# Patient Record
Sex: Female | Born: 1975 | Race: Black or African American | Hispanic: No | Marital: Single | Smoking: Never smoker
Health system: Southern US, Community
[De-identification: ages and names within clinical notes are randomized; demographics above are authoritative.]

## PROBLEM LIST (undated history)

## (undated) DIAGNOSIS — D649 Anemia, unspecified: Secondary | ICD-10-CM

## (undated) DIAGNOSIS — T7840XA Allergy, unspecified, initial encounter: Secondary | ICD-10-CM

## (undated) DIAGNOSIS — Z9889 Other specified postprocedural states: Secondary | ICD-10-CM

## (undated) DIAGNOSIS — I10 Essential (primary) hypertension: Secondary | ICD-10-CM

## (undated) DIAGNOSIS — R112 Nausea with vomiting, unspecified: Secondary | ICD-10-CM

## (undated) HISTORY — DX: Other specified postprocedural states: Z98.890

## (undated) HISTORY — DX: Nausea with vomiting, unspecified: R11.2

## (undated) HISTORY — DX: Allergy, unspecified, initial encounter: T78.40XA

## (undated) HISTORY — DX: Essential (primary) hypertension: I10

## (undated) HISTORY — PX: TUBAL LIGATION: SHX77

---

## 1995-02-27 HISTORY — PX: DIAGNOSTIC LAPAROSCOPY WITH REMOVAL OF ECTOPIC PREGNANCY: SHX6449

## 2000-10-11 ENCOUNTER — Emergency Department (HOSPITAL_COMMUNITY): Admission: EM | Admit: 2000-10-11 | Discharge: 2000-10-12 | Payer: Self-pay

## 2000-10-18 ENCOUNTER — Inpatient Hospital Stay (HOSPITAL_COMMUNITY): Admission: AD | Admit: 2000-10-18 | Discharge: 2000-10-18 | Payer: Self-pay | Admitting: Obstetrics & Gynecology

## 2001-03-26 ENCOUNTER — Emergency Department (HOSPITAL_COMMUNITY): Admission: EM | Admit: 2001-03-26 | Discharge: 2001-03-27 | Payer: Self-pay

## 2001-08-11 ENCOUNTER — Emergency Department (HOSPITAL_COMMUNITY): Admission: EM | Admit: 2001-08-11 | Discharge: 2001-08-11 | Payer: Self-pay | Admitting: *Deleted

## 2004-11-17 ENCOUNTER — Inpatient Hospital Stay (HOSPITAL_COMMUNITY): Admission: AD | Admit: 2004-11-17 | Discharge: 2004-11-17 | Payer: Self-pay | Admitting: Obstetrics & Gynecology

## 2005-12-21 ENCOUNTER — Emergency Department (HOSPITAL_COMMUNITY): Admission: EM | Admit: 2005-12-21 | Discharge: 2005-12-21 | Payer: Self-pay | Admitting: Family Medicine

## 2006-01-09 ENCOUNTER — Inpatient Hospital Stay (HOSPITAL_COMMUNITY): Admission: AD | Admit: 2006-01-09 | Discharge: 2006-01-09 | Payer: Self-pay | Admitting: Obstetrics and Gynecology

## 2008-08-19 ENCOUNTER — Emergency Department (HOSPITAL_COMMUNITY): Admission: EM | Admit: 2008-08-19 | Discharge: 2008-08-19 | Payer: Self-pay | Admitting: Emergency Medicine

## 2009-12-05 ENCOUNTER — Emergency Department (HOSPITAL_COMMUNITY): Admission: EM | Admit: 2009-12-05 | Discharge: 2009-12-05 | Payer: Self-pay | Admitting: Emergency Medicine

## 2009-12-12 ENCOUNTER — Emergency Department (HOSPITAL_COMMUNITY): Admission: EM | Admit: 2009-12-12 | Discharge: 2009-12-12 | Payer: Self-pay | Admitting: Emergency Medicine

## 2009-12-20 ENCOUNTER — Emergency Department (HOSPITAL_COMMUNITY): Admission: EM | Admit: 2009-12-20 | Discharge: 2009-12-20 | Payer: Self-pay | Admitting: Emergency Medicine

## 2010-05-10 LAB — DIFFERENTIAL
Basophils Absolute: 0 10*3/uL (ref 0.0–0.1)
Basophils Relative: 0 % (ref 0–1)
Lymphocytes Relative: 11 % — ABNORMAL LOW (ref 12–46)
Monocytes Relative: 8 % (ref 3–12)
Neutro Abs: 8.9 10*3/uL — ABNORMAL HIGH (ref 1.7–7.7)
Neutrophils Relative %: 81 % — ABNORMAL HIGH (ref 43–77)

## 2010-05-10 LAB — CBC
HCT: 35.3 % — ABNORMAL LOW (ref 36.0–46.0)
Hemoglobin: 11.7 g/dL — ABNORMAL LOW (ref 12.0–15.0)
MCH: 28 pg (ref 26.0–34.0)
MCHC: 33.2 g/dL (ref 30.0–36.0)
MCV: 84.3 fL (ref 78.0–100.0)
Platelets: 256 10*3/uL (ref 150–400)
RBC: 4.18 MIL/uL (ref 3.87–5.11)
RDW: 15.2 % (ref 11.5–15.5)
WBC: 11 10*3/uL — ABNORMAL HIGH (ref 4.0–10.5)

## 2010-05-10 LAB — BASIC METABOLIC PANEL
BUN: 8 mg/dL (ref 6–23)
CO2: 29 mEq/L (ref 19–32)
Calcium: 9.6 mg/dL (ref 8.4–10.5)
Chloride: 103 mEq/L (ref 96–112)
Creatinine, Ser: 0.8 mg/dL (ref 0.4–1.2)
GFR calc Af Amer: >60 mL/min (ref >60)
GFR calc non Af Amer: >60 mL/min (ref >60)
Glucose, Bld: 113 mg/dL — ABNORMAL HIGH (ref 70–99)
Potassium: 3.8 mEq/L (ref 3.5–5.1)
Sodium: 138 mEq/L (ref 135–145)

## 2012-06-03 ENCOUNTER — Emergency Department (HOSPITAL_COMMUNITY)
Admission: EM | Admit: 2012-06-03 | Discharge: 2012-06-04 | Disposition: A | Discharge status: Home / Self Care | Payer: No Typology Code available for payment source | Attending: Emergency Medicine | Admitting: Emergency Medicine

## 2012-06-03 ENCOUNTER — Encounter (HOSPITAL_COMMUNITY): Payer: Self-pay

## 2012-06-03 DIAGNOSIS — Y92410 Unspecified street and highway as the place of occurrence of the external cause: Secondary | ICD-10-CM | POA: Insufficient documentation

## 2012-06-03 DIAGNOSIS — M549 Dorsalgia, unspecified: Secondary | ICD-10-CM

## 2012-06-03 DIAGNOSIS — Y9389 Activity, other specified: Secondary | ICD-10-CM | POA: Insufficient documentation
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
## 2012-06-04 ENCOUNTER — Emergency Department (HOSPITAL_COMMUNITY): Payer: No Typology Code available for payment source

## 2012-06-04 MED ORDER — OXYCODONE-ACETAMINOPHEN 5-325 MG PO TABS: 1.0000 | ORAL_TABLET | ORAL | Status: DC | PRN

## 2012-06-04 MED ORDER — CYCLOBENZAPRINE HCL 10 MG PO TABS: 10.0000 mg | ORAL_TABLET | Freq: Two times a day (BID) | ORAL | Status: DC | PRN

## 2012-06-04 NOTE — ED Provider Notes (Signed)
Medical screening examination/treatment/procedure(s) were performed by non-physician practitioner and as supervising physician I was immediately available for consultation/collaboration. Devoria Albe, MD, Armando Gang   Ward Givens, MD 06/04/12 [phone]

## 2015-01-05 ENCOUNTER — Encounter (HOSPITAL_COMMUNITY): Payer: Self-pay

## 2015-01-05 ENCOUNTER — Inpatient Hospital Stay (HOSPITAL_COMMUNITY)
Admission: AD | Admit: 2015-01-05 | Discharge: 2015-01-05 | Disposition: A | Discharge status: Home / Self Care | Payer: Self-pay | Source: Ambulatory Visit | Attending: Family Medicine | Admitting: Family Medicine

## 2015-01-05 DIAGNOSIS — N938 Other specified abnormal uterine and vaginal bleeding: Secondary | ICD-10-CM | POA: Insufficient documentation

## 2015-01-05 DIAGNOSIS — D649 Anemia, unspecified: Secondary | ICD-10-CM | POA: Insufficient documentation

## 2015-01-05 LAB — CBC
HEMATOCRIT: 30.2 % — AB (ref 36.0–46.0)
HEMOGLOBIN: 9.5 g/dL — AB (ref 12.0–15.0)
MCH: 25.7 pg — AB (ref 26.0–34.0)
MCHC: 31.5 g/dL (ref 30.0–36.0)
MCV: 81.8 fL (ref 78.0–100.0)
Platelets: 306 10*3/uL (ref 150–400)
RBC: 3.69 MIL/uL — AB (ref 3.87–5.11)
RDW: 15.2 % (ref 11.5–15.5)
WBC: 5.6 10*3/uL (ref 4.0–10.5)

## 2015-01-05 LAB — URINALYSIS, ROUTINE W REFLEX MICROSCOPIC
BILIRUBIN URINE: NEGATIVE
GLUCOSE, UA: NEGATIVE mg/dL
KETONES UR: 15 mg/dL — AB
Leukocytes, UA: NEGATIVE
Nitrite: NEGATIVE
PROTEIN: NEGATIVE mg/dL
Specific Gravity, Urine: >1.03 — ABNORMAL HIGH (ref 1.005–1.030)
UROBILINOGEN UA: 1 mg/dL (ref 0.0–1.0)
pH: 6 (ref 5.0–8.0)

## 2015-01-05 LAB — POCT PREGNANCY, URINE: PREG TEST UR: NEGATIVE

## 2015-01-05 LAB — URINE MICROSCOPIC-ADD ON

## 2015-01-05 LAB — TSH: TSH: 0.945 u[IU]/mL (ref 0.350–4.500)

## 2015-01-05 MED ORDER — MEGESTROL ACETATE 40 MG PO TABS: 40.0000 mg | ORAL_TABLET | Freq: Every day | ORAL | Status: DC

## 2015-01-05 MED ORDER — FERROUS SULFATE 325 (65 FE) MG PO TABS
325.0000 mg | ORAL_TABLET | Freq: Two times a day (BID) | ORAL | Status: DC
Start: 2015-01-05 — End: 2015-04-13

## 2015-01-05 NOTE — Discharge Instructions (Signed)

## 2015-01-05 NOTE — MAU Provider Note (Signed)
Chief Complaint: Vaginal Bleeding   First Provider Initiated Contact with Patient 01/05/15 [phone]     SUBJECTIVE HPI: Tonya Livingston is a 39 y.o. [id number] who presents to Maternity Admissions reporting bleeding with clots for the past 3 weeks.  States has had regular periods in the past until March 2016.  She had one in March then not periods until June. Then she did not have another period until 3 weeks ago.   Not bleeding heavily. PMH is remarkable for anemia.   Location: uterine Quality: crampy Severity: mild Modifying factors:  Had a tube removed in 1997 then after her last baby had the other tube tied Signs and symptoms:  Bleeding with clots, only 1-2 pads per day  Past Medical History  Diagnosis Date  . Medical history non-contributory    OB History  Gravida Para Term Preterm AB SAB TAB Ectopic Multiple Living  5 4 4  0 1 0 0 1 0 4    # Outcome Date GA Lbr Len/2nd Weight Sex Delivery Anes PTL Lv  5 Ectopic           4 Term           3 Term           2 Term           1 Term              Past Surgical History  Procedure Laterality Date  . Tubal ligation     Social History   Social History  . Marital Status: Single    Spouse Name: N/A  . Number of Children: N/A  . Years of Education: N/A   Occupational History  . Not on file.   Social History Main Topics  . Smoking status: Never Smoker   . Smokeless tobacco: Never Used  . Alcohol Use: No  . Drug Use: No  . Sexual Activity: Yes    Birth Control/ Protection: Surgical   Other Topics Concern  . Not on file   Social History Narrative   No current facility-administered medications on file prior to encounter.   Current Outpatient Prescriptions on File Prior to Encounter  Medication Sig Dispense Refill  . cyclobenzaprine (FLEXERIL) 10 MG tablet Take 1 tablet (10 mg total) by mouth 2 (two) times daily as needed for muscle spasms. (Patient not taking: Reported on 01/05/2015) 20 tablet 0  . oxyCODONE-acetaminophen  (PERCOCET/ROXICET) 5-325 MG per tablet Take 1 tablet by mouth every 4 (four) hours as needed for pain. (Patient not taking: Reported on 01/05/2015) 15 tablet 0   No Known Allergies  I have reviewed the past Medical Hx, Surgical Hx, Social Hx, Allergies and Medications.   Review of Systems  Constitutional: Negative for fever, chills and malaise/fatigue.  Gastrointestinal: Positive for abdominal pain. Negative for nausea, vomiting, diarrhea and constipation.  Musculoskeletal: Negative for back pain.  Neurological: Negative for dizziness, weakness and headaches.    OBJECTIVE Patient Vitals for the past 24 hrs:  BP Temp Pulse Resp Height Weight  01/05/15 0932 - - - - 5\' 6"  (1.676 m) 205 lb (92.987 kg)  01/05/15 0914 124/72 mmHg 98 F (36.7 C) 79 16 - -   Constitutional: Well-developed, well-nourished female in no acute distress.  Cardiovascular: normal rate Respiratory: normal rate and effort.  GI: Abd soft, non-tender, gravid appropriate for gestational age. Pos BS x 4 MS: Extremities nontender, no edema, normal ROM Neurologic: Alert and oriented x 4.  GU: Neg CVAT.  SPECULUM EXAM:  moderate burgundy blood in vault  BIMANUAL: cervix closed; uterus slightly enlarged, especially posteriorly, no adnexal tenderness or masses  LAB RESULTS Results for orders placed or performed during the hospital encounter of 01/05/15 (from the past 24 hour(s))  Urinalysis, Routine w reflex microscopic (not at [facility])     Status: Abnormal   Collection Time: 01/05/15  9:15 AM  Result Value Ref Range   Color, Urine YELLOW YELLOW   APPearance CLOUDY (A) CLEAR   Specific Gravity, Urine >1.030 (H) 1.005 - 1.030   pH 6.0 5.0 - 8.0   Glucose, UA NEGATIVE NEGATIVE mg/dL   Hgb urine dipstick LARGE (A) NEGATIVE   Bilirubin Urine NEGATIVE NEGATIVE   Ketones, ur 15 (A) NEGATIVE mg/dL   Protein, ur NEGATIVE NEGATIVE mg/dL   Urobilinogen, UA 1.0 0.0 - 1.0 mg/dL   Nitrite NEGATIVE NEGATIVE   Leukocytes, UA  NEGATIVE NEGATIVE  Urine microscopic-add on     Status: Abnormal   Collection Time: 01/05/15  9:15 AM  Result Value Ref Range   Squamous Epithelial / LPF FEW (A) RARE   WBC, UA 0-2 <3 WBC/hpf   RBC / HPF 21-50 <3 RBC/hpf   Bacteria, UA RARE RARE  Pregnancy, urine POC     Status: None   Collection Time: 01/05/15  9:27 AM  Result Value Ref Range   Preg Test, Ur NEGATIVE NEGATIVE    IMAGING No results found. Will schedule outpatient ultrasound to look at endometrial thickness and possible fibroids  MAU COURSE Will check CBC due to long term nature of bleeding. WIll also check TSH since menses are newly irregular.   Results for orders placed or performed during the hospital encounter of 01/05/15 (from the past 24 hour(s))  Urinalysis, Routine w reflex microscopic (not at [facility])     Status: Abnormal   Collection Time: 01/05/15  9:15 AM  Result Value Ref Range   Color, Urine YELLOW YELLOW   APPearance CLOUDY (A) CLEAR   Specific Gravity, Urine >1.030 (H) 1.005 - 1.030   pH 6.0 5.0 - 8.0   Glucose, UA NEGATIVE NEGATIVE mg/dL   Hgb urine dipstick LARGE (A) NEGATIVE   Bilirubin Urine NEGATIVE NEGATIVE   Ketones, ur 15 (A) NEGATIVE mg/dL   Protein, ur NEGATIVE NEGATIVE mg/dL   Urobilinogen, UA 1.0 0.0 - 1.0 mg/dL   Nitrite NEGATIVE NEGATIVE   Leukocytes, UA NEGATIVE NEGATIVE  Urine microscopic-add on     Status: Abnormal   Collection Time: 01/05/15  9:15 AM  Result Value Ref Range   Squamous Epithelial / LPF FEW (A) RARE   WBC, UA 0-2 <3 WBC/hpf   RBC / HPF 21-50 <3 RBC/hpf   Bacteria, UA RARE RARE  Pregnancy, urine POC     Status: None   Collection Time: 01/05/15  9:27 AM  Result Value Ref Range   Preg Test, Ur NEGATIVE NEGATIVE  CBC     Status: Abnormal   Collection Time: 01/05/15 10:00 AM  Result Value Ref Range   WBC 5.6 4.0 - 10.5 K/uL   RBC 3.69 (L) 3.87 - 5.11 MIL/uL   Hemoglobin 9.5 (L) 12.0 - 15.0 g/dL   HCT 30.2 (L) 36.0 - 46.0 %   MCV 81.8 78.0 - 100.0 fL   MCH  25.7 (L) 26.0 - 34.0 pg   MCHC 31.5 30.0 - 36.0 g/dL   RDW 15.2 11.5 - 15.5 %   Platelets 306 150 - 400 K/uL  TSH     Status: None   Collection  Time: 01/05/15 10:00 AM  Result Value Ref Range   TSH 0.945 0.350 - 4.500 uIU/mL    MDM See above.  Anemia is mild, will restart Iron Will schedule out patient [place] and then f/u in clinic  ASSESSMENT Dysfunctional Uterine Bleeding Mild anemia  PLAN Discharge home in stable condition. Bleeding Precautions Rx FeSO4 bid for anemia [place] ordered for endometrial thickness and possible fibroids Rx Megace 40mg  QD Followup in clinic for possible endometrial biopsy    Medication List    ASK your doctor about these medications        cyclobenzaprine 10 MG tablet  Commonly known as:  FLEXERIL  Take 1 tablet (10 mg total) by mouth 2 (two) times daily as needed for muscle spasms.     oxyCODONE-acetaminophen 5-325 MG tablet  Commonly known as:  PERCOCET/ROXICET  Take 1 tablet by mouth every 4 (four) hours as needed for pain.         Seabron Spates, CNM 01/05/2015  10:09 AM

## 2015-01-05 NOTE — MAU Note (Signed)
Pt presents to MAU with complaints of having vaginal bleeding for 3 weeks. Pt states that she is passing clots, bleeding is not heavy, can wear one pad a day.

## 2015-01-08 ENCOUNTER — Ambulatory Visit (HOSPITAL_BASED_OUTPATIENT_CLINIC_OR_DEPARTMENT_OTHER): Admission: RE | Admit: 2015-01-08 | Payer: Self-pay | Source: Ambulatory Visit

## 2015-01-08 ENCOUNTER — Ambulatory Visit (HOSPITAL_BASED_OUTPATIENT_CLINIC_OR_DEPARTMENT_OTHER)
Admission: RE | Admit: 2015-01-08 | Discharge: 2015-01-08 | Disposition: A | Discharge status: Home / Self Care | Payer: Self-pay | Source: Ambulatory Visit | Attending: Advanced Practice Midwife | Admitting: Advanced Practice Midwife

## 2015-01-08 DIAGNOSIS — N938 Other specified abnormal uterine and vaginal bleeding: Secondary | ICD-10-CM | POA: Insufficient documentation

## 2015-01-08 DIAGNOSIS — Z8759 Personal history of other complications of pregnancy, childbirth and the puerperium: Secondary | ICD-10-CM | POA: Insufficient documentation

## 2015-01-08 DIAGNOSIS — Z9851 Tubal ligation status: Secondary | ICD-10-CM | POA: Insufficient documentation

## 2015-01-10 DIAGNOSIS — N938 Other specified abnormal uterine and vaginal bleeding: Secondary | ICD-10-CM | POA: Insufficient documentation

## 2015-02-04 ENCOUNTER — Other Ambulatory Visit (HOSPITAL_COMMUNITY)
Admission: RE | Admit: 2015-02-04 | Discharge: 2015-02-04 | Disposition: A | Discharge status: Home / Self Care | Payer: Self-pay | Source: Ambulatory Visit | Attending: Obstetrics & Gynecology | Admitting: Obstetrics & Gynecology

## 2015-02-04 ENCOUNTER — Ambulatory Visit (INDEPENDENT_AMBULATORY_CARE_PROVIDER_SITE_OTHER): Payer: Self-pay | Admitting: Obstetrics & Gynecology

## 2015-02-04 ENCOUNTER — Encounter: Payer: Self-pay | Admitting: Obstetrics & Gynecology

## 2015-02-04 VITALS — BP 119/70 | HR 70 | Ht 66.0 in | Wt 197.0 lb

## 2015-02-04 DIAGNOSIS — R87619 Unspecified abnormal cytological findings in specimens from cervix uteri: Secondary | ICD-10-CM | POA: Insufficient documentation

## 2015-02-04 DIAGNOSIS — N938 Other specified abnormal uterine and vaginal bleeding: Secondary | ICD-10-CM

## 2015-02-04 DIAGNOSIS — R938 Abnormal findings on diagnostic imaging of other specified body structures: Secondary | ICD-10-CM

## 2015-02-04 DIAGNOSIS — R9389 Abnormal findings on diagnostic imaging of other specified body structures: Secondary | ICD-10-CM

## 2015-02-04 DIAGNOSIS — D500 Iron deficiency anemia secondary to blood loss (chronic): Secondary | ICD-10-CM

## 2015-02-04 LAB — POCT PREGNANCY, URINE: PREG TEST UR: NEGATIVE

## 2015-02-04 NOTE — Addendum Note (Signed)
Addended by: Christiana Pellant A on: 02/04/2015 11:16 AM   Modules accepted: Orders

## 2015-02-04 NOTE — Progress Notes (Signed)
   Subjective:    Patient ID: Tonya Livingston, female    DOB: Jul 05, 1975, 39 y.o.   MRN: [id number]  HPI  39 S AA P40 yo lady here for follow up after an ER visit for heavy bleeding. An U/S showed a thickendd endometrium which may have debris.   Review of Systems She had a BTL.    Objective:   Physical Exam  WNWHBFNAD Breathing, conversing, and ambulating normally A cervical polyp was noted and easily removed.  UPT negative, consent signed, time out done Cervix prepped with betadine and the lower cervical lip grasped with a single tooth tenaculum Uterus sounded to 8 cm Pipelle used for 2 passes with a small amount of tissue obtained. A gritty sensation was appreciated. I am wondering if I was still in the endocervix but I could not get the Pipelle to go any further. She tolerated the procedure well.        Assessment & Plan:  DUB- send polyp for pathology and long with EMBX Rec megace for 30 days and then repeat gyn u/s

## 2015-03-07 ENCOUNTER — Ambulatory Visit (HOSPITAL_COMMUNITY)
Admission: RE | Admit: 2015-03-07 | Discharge: 2015-03-07 | Disposition: A | Discharge status: Home / Self Care | Payer: Self-pay | Source: Ambulatory Visit | Attending: Obstetrics & Gynecology | Admitting: Obstetrics & Gynecology

## 2015-03-07 DIAGNOSIS — R9389 Abnormal findings on diagnostic imaging of other specified body structures: Secondary | ICD-10-CM

## 2015-03-07 DIAGNOSIS — R938 Abnormal findings on diagnostic imaging of other specified body structures: Secondary | ICD-10-CM | POA: Insufficient documentation

## 2015-03-07 DIAGNOSIS — N939 Abnormal uterine and vaginal bleeding, unspecified: Secondary | ICD-10-CM | POA: Insufficient documentation

## 2015-03-11 ENCOUNTER — Ambulatory Visit (INDEPENDENT_AMBULATORY_CARE_PROVIDER_SITE_OTHER): Payer: PRIVATE HEALTH INSURANCE | Admitting: Obstetrics & Gynecology

## 2015-03-11 ENCOUNTER — Encounter (HOSPITAL_COMMUNITY): Payer: Self-pay | Admitting: *Deleted

## 2015-03-11 VITALS — BP 116/89 | HR 82 | Temp 98.4°F | Resp 19 | Ht 66.0 in | Wt 199.6 lb

## 2015-03-11 DIAGNOSIS — D500 Iron deficiency anemia secondary to blood loss (chronic): Secondary | ICD-10-CM | POA: Diagnosis not present

## 2015-03-11 DIAGNOSIS — N938 Other specified abnormal uterine and vaginal bleeding: Secondary | ICD-10-CM | POA: Diagnosis not present

## 2015-03-11 LAB — CBC
HEMATOCRIT: 34.6 % — AB (ref 36.0–46.0)
HEMOGLOBIN: 10.2 g/dL — AB (ref 12.0–15.0)
MCH: 23.6 pg — AB (ref 26.0–34.0)
MCHC: 29.5 g/dL — AB (ref 30.0–36.0)
MCV: 79.9 fL (ref 78.0–100.0)
MPV: 10.1 fL (ref 8.6–12.4)
Platelets: 462 10*3/uL — ABNORMAL HIGH (ref 150–400)
RBC: 4.33 MIL/uL (ref 3.87–5.11)
RDW: 18.7 % — ABNORMAL HIGH (ref 11.5–15.5)
WBC: 6.8 10*3/uL (ref 4.0–10.5)

## 2015-03-11 LAB — TSH: TSH: 1.206 u[IU]/mL (ref 0.350–4.500)

## 2015-03-11 NOTE — Progress Notes (Signed)
Patient ID: Tonya Livingston, female   DOB: 09/04/1975, 40 y.o.   MRN: [id number] Pt reports still bleeding since before her procedure. Pt can wear a maxi pad and it will last all day yet pt is concerned to why she is still bleeding.

## 2015-03-11 NOTE — Progress Notes (Signed)
   Subjective:    Patient ID: Tonya Livingston, female    DOB: 22-Sep-1975, 40 y.o.   MRN: [id number]  HPI 40 yo S AA P4 (81, 46, 41, and 45 yo kids, 3 grands) here today to discuss her EMBX results and her DUB. She has had heavy long periods and her hbg got down to 8.5. She is taking iron BID. She has been on megace. Her bleeding has slowed down on megace but is still happening every day.   Review of Systems She had a BTL in 1998.    Objective:   Physical Exam  WNWHBFNAD Breathing, conversing, and ambulating normally Abd- benign     Assessment & Plan:  DUB with anemia- recheck tsh, cbc today Schedule d&c and ablation

## 2015-03-21 ENCOUNTER — Inpatient Hospital Stay (HOSPITAL_COMMUNITY)
Admission: AD | Admit: 2015-03-21 | Discharge: 2015-03-21 | Disposition: A | Discharge status: Home / Self Care | Payer: PRIVATE HEALTH INSURANCE | Source: Ambulatory Visit | Attending: Family Medicine | Admitting: Family Medicine

## 2015-03-21 ENCOUNTER — Encounter (HOSPITAL_COMMUNITY): Payer: Self-pay | Admitting: *Deleted

## 2015-03-21 DIAGNOSIS — N946 Dysmenorrhea, unspecified: Secondary | ICD-10-CM

## 2015-03-21 DIAGNOSIS — R102 Pelvic and perineal pain: Secondary | ICD-10-CM | POA: Insufficient documentation

## 2015-03-21 LAB — URINALYSIS, ROUTINE W REFLEX MICROSCOPIC
BILIRUBIN URINE: NEGATIVE
GLUCOSE, UA: NEGATIVE mg/dL
KETONES UR: NEGATIVE mg/dL
Nitrite: NEGATIVE
PH: 7.5 (ref 5.0–8.0)
Protein, ur: 30 mg/dL — AB
SPECIFIC GRAVITY, URINE: 1.015 (ref 1.005–1.030)

## 2015-03-21 LAB — URINE MICROSCOPIC-ADD ON

## 2015-03-21 LAB — CBC
HEMATOCRIT: 33.3 % — AB (ref 36.0–46.0)
HEMOGLOBIN: 10.4 g/dL — AB (ref 12.0–15.0)
MCH: 24.4 pg — AB (ref 26.0–34.0)
MCHC: 31.2 g/dL (ref 30.0–36.0)
MCV: 78 fL (ref 78.0–100.0)
Platelets: 329 10*3/uL (ref 150–400)
RBC: 4.27 MIL/uL (ref 3.87–5.11)
RDW: 18.7 % — AB (ref 11.5–15.5)
WBC: 8.4 10*3/uL (ref 4.0–10.5)

## 2015-03-21 LAB — POCT PREGNANCY, URINE: Preg Test, Ur: NEGATIVE

## 2015-03-21 MED ORDER — KETOROLAC TROMETHAMINE 60 MG/2ML IM SOLN
60.0000 mg | Freq: Once | INTRAMUSCULAR | Status: AC
  Administered 2015-03-21: 60 mg via INTRAMUSCULAR
  Filled 2015-03-21: qty 2

## 2015-03-21 MED ORDER — IBUPROFEN 800 MG PO TABS: 800.0000 mg | ORAL_TABLET | Freq: Three times a day (TID) | ORAL | Status: DC | PRN

## 2015-03-21 NOTE — MAU Provider Note (Signed)
History     CSN: [id number]  Arrival date and time: 03/21/15 2002   First Provider Initiated Contact with Patient 03/21/15 2208      Chief Complaint  Patient presents with  . Vaginal Bleeding   HPI Comments: Tonya Livingston is a 40 y.o. [id number] who presents today with cramping and bleeding. She states that she been bleeding for 90 days. She was seen in the office on 1/13 and had an [place] on 1/9. She is scheduled for a D&C, and ablation on 2/15. She states that yesterday the bleeding and pain had increased. She states that it is better today. She has been using a heating pad with some relief. She has not tried any other medications for the cramping.   Vaginal Bleeding The patient's primary symptoms include pelvic pain and vaginal bleeding. This is a new problem. The current episode started more than 1 month ago. The problem occurs intermittently. The problem has been unchanged. Pain severity now: 7/10. The problem affects both sides. She is not pregnant. Associated symptoms include abdominal pain and nausea. Pertinent negatives include no chills, constipation, diarrhea, dysuria, fever, frequency, urgency or vomiting. The vaginal discharge was bloody. The vaginal bleeding is typical of menses. She has not been passing clots. She has not been passing tissue. Nothing aggravates the symptoms. She has tried heating pads for the symptoms. The treatment provided moderate relief. Her menstrual history has been irregular.     Past Medical History  Diagnosis Date  . Medical history non-contributory     Past Surgical History  Procedure Laterality Date  . Tubal ligation      Family History  Problem Relation Age of Onset  . Diabetes Maternal Aunt   . Diabetes Maternal Grandmother   . Hypertension Maternal Grandmother     Social History  Substance Use Topics  . Smoking status: Never Smoker   . Smokeless tobacco: Never Used  . Alcohol Use: No    Allergies: No Known  Allergies  Prescriptions prior to admission  Medication Sig Dispense Refill Last Dose  . cyclobenzaprine (FLEXERIL) 10 MG tablet Take 1 tablet (10 mg total) by mouth 2 (two) times daily as needed for muscle spasms. (Patient not taking: Reported on 01/05/2015) 20 tablet 0 Not Taking  . ferrous sulfate (FERROUSUL) 325 (65 FE) MG tablet Take 1 tablet (325 mg total) by mouth 2 (two) times daily. 60 tablet 1 Taking  . megestrol (MEGACE) 40 MG tablet Take 1 tablet (40 mg total) by mouth daily. 60 tablet 5 Taking  . oxyCODONE-acetaminophen (PERCOCET/ROXICET) 5-325 MG per tablet Take 1 tablet by mouth every 4 (four) hours as needed for pain. (Patient not taking: Reported on 01/05/2015) 15 tablet 0 Not Taking    Review of Systems  Constitutional: Negative for fever and chills.  Gastrointestinal: Positive for nausea and abdominal pain. Negative for vomiting, diarrhea and constipation.  Genitourinary: Positive for vaginal bleeding and pelvic pain. Negative for dysuria, urgency and frequency.   Physical Exam   Blood pressure 122/80, pulse 85, temperature 98.5 F (36.9 C), temperature source Oral, resp. rate 18, height 5\' 4"  (1.626 m), weight 88.962 kg (196 lb 2 oz), last menstrual period 12/17/2014.  Physical Exam  Nursing note and vitals reviewed. Constitutional: She is oriented to person, place, and time. She appears well-developed and well-nourished. No distress.  HENT:  Head: Normocephalic.  Cardiovascular: Normal rate.   Respiratory: Effort normal.  GI: Soft. There is no tenderness. There is no rebound.  Neurological: She is alert  and oriented to person, place, and time.  Skin: Skin is warm and dry.   Results for orders placed or performed during the hospital encounter of 03/21/15 (from the past 24 hour(s))  Urinalysis, Routine w reflex microscopic (not at [facility])     Status: Abnormal   Collection Time: 03/21/15  8:19 PM  Result Value Ref Range   Color, Urine YELLOW YELLOW   APPearance  CLOUDY (A) CLEAR   Specific Gravity, Urine 1.015 1.005 - 1.030   pH 7.5 5.0 - 8.0   Glucose, UA NEGATIVE NEGATIVE mg/dL   Hgb urine dipstick LARGE (A) NEGATIVE   Bilirubin Urine NEGATIVE NEGATIVE   Ketones, ur NEGATIVE NEGATIVE mg/dL   Protein, ur 30 (A) NEGATIVE mg/dL   Nitrite NEGATIVE NEGATIVE   Leukocytes, UA TRACE (A) NEGATIVE  Urine microscopic-add on     Status: Abnormal   Collection Time: 03/21/15  8:19 PM  Result Value Ref Range   Squamous Epithelial / LPF 0-5 (A) NONE SEEN   WBC, UA 0-5 0 - 5 WBC/hpf   RBC / HPF TOO NUMEROUS TO COUNT 0 - 5 RBC/hpf   Bacteria, UA FEW (A) NONE SEEN   Urine-Other MUCOUS PRESENT   Pregnancy, urine POC     Status: None   Collection Time: 03/21/15  8:33 PM  Result Value Ref Range   Preg Test, Ur NEGATIVE NEGATIVE  CBC     Status: Abnormal   Collection Time: 03/21/15  9:33 PM  Result Value Ref Range   WBC 8.4 4.0 - 10.5 K/uL   RBC 4.27 3.87 - 5.11 MIL/uL   Hemoglobin 10.4 (L) 12.0 - 15.0 g/dL   HCT 33.3 (L) 36.0 - 46.0 %   MCV 78.0 78.0 - 100.0 fL   MCH 24.4 (L) 26.0 - 34.0 pg   MCHC 31.2 30.0 - 36.0 g/dL   RDW 18.7 (H) 11.5 - 15.5 %   Platelets 329 150 - 400 K/uL    MAU Course  Procedures  MDM Patient has had toradol. She reports her pain is better at this time.   Assessment and Plan   1. Dysmenorrhea    DC home Comfort measures reviewed  Bleeding precautions RX: ibuprofen 800mg  PRN #30  Return to MAU as needed FU with OB as planned  Follow-up Information    Follow up with Emily Filbert., MD.   Specialty:  Obstetrics and Gynecology   Why:  As needed   Contact information:   [place] [address] [phone]         Mathis Bud 03/21/2015, 10:13 PM

## 2015-03-21 NOTE — MAU Note (Addendum)
PT SAYS  SHE HAS BLEEDING  SINCE  LMP  IN    OCT.       SAW   DR  [facility]OVE   ON 1-9.   IS  [facility]  FOR    SURGERY  ON 2-15.     USUALLY   DOES  NOT  HAVE  PAIN   BUT  THIS  PAIN STARTED  YESTERDAY.      NO MEDS  FOR PAIN - ONLY HEATING  PAD.       PAD ON IN TRIAGE-     SMALL AMT  LIGHT  RED.       NO BIRTH  CONTROL.  LAST SEX-  3  MTHS  AGO.

## 2015-03-21 NOTE — Discharge Instructions (Signed)

## 2015-04-13 ENCOUNTER — Encounter (HOSPITAL_COMMUNITY)
Admission: RE | Disposition: A | Discharge status: Home / Self Care | Payer: Self-pay | Source: Ambulatory Visit | Attending: Obstetrics & Gynecology

## 2015-04-13 ENCOUNTER — Ambulatory Visit (HOSPITAL_COMMUNITY)
Admission: RE | Admit: 2015-04-13 | Discharge: 2015-04-13 | Disposition: A | Discharge status: Home / Self Care | Payer: PRIVATE HEALTH INSURANCE | Source: Ambulatory Visit | Attending: Obstetrics & Gynecology | Admitting: Obstetrics & Gynecology

## 2015-04-13 ENCOUNTER — Ambulatory Visit (HOSPITAL_COMMUNITY): Payer: PRIVATE HEALTH INSURANCE | Admitting: Certified Registered Nurse Anesthetist

## 2015-04-13 ENCOUNTER — Encounter (HOSPITAL_COMMUNITY): Payer: Self-pay | Admitting: Certified Registered Nurse Anesthetist

## 2015-04-13 DIAGNOSIS — N938 Other specified abnormal uterine and vaginal bleeding: Secondary | ICD-10-CM | POA: Diagnosis not present

## 2015-04-13 DIAGNOSIS — D649 Anemia, unspecified: Secondary | ICD-10-CM | POA: Diagnosis not present

## 2015-04-13 HISTORY — PX: DILITATION & CURRETTAGE/HYSTROSCOPY WITH NOVASURE ABLATION: SHX5568

## 2015-04-13 HISTORY — DX: Anemia, unspecified: D64.9

## 2015-04-13 LAB — CBC
HEMATOCRIT: 33 % — AB (ref 36.0–46.0)
HEMOGLOBIN: 9.8 g/dL — AB (ref 12.0–15.0)
MCH: 23 pg — AB (ref 26.0–34.0)
MCHC: 29.7 g/dL — ABNORMAL LOW (ref 30.0–36.0)
MCV: 77.5 fL — AB (ref 78.0–100.0)
PLATELETS: 337 10*3/uL (ref 150–400)
RBC: 4.26 MIL/uL (ref 3.87–5.11)
RDW: 17.2 % — ABNORMAL HIGH (ref 11.5–15.5)
WBC: 6.2 10*3/uL (ref 4.0–10.5)

## 2015-04-13 LAB — PREGNANCY, URINE: PREG TEST UR: NEGATIVE

## 2015-04-13 SURGERY — DILATATION & CURETTAGE/HYSTEROSCOPY WITH NOVASURE ABLATION
Anesthesia: General | Site: Uterus

## 2015-04-13 MED ORDER — DEXAMETHASONE SODIUM PHOSPHATE 10 MG/ML IJ SOLN
INTRAMUSCULAR | Status: DC | PRN
  Administered 2015-04-13: 4 mg via INTRAVENOUS

## 2015-04-13 MED ORDER — SCOPOLAMINE 1 MG/3DAYS TD PT72
MEDICATED_PATCH | TRANSDERMAL | Status: AC
  Administered 2015-04-13: 1.5 mg via TRANSDERMAL
  Filled 2015-04-13: qty 1

## 2015-04-13 MED ORDER — PROPOFOL 10 MG/ML IV BOLUS
INTRAVENOUS | Status: AC
  Filled 2015-04-13: qty 20

## 2015-04-13 MED ORDER — SCOPOLAMINE 1 MG/3DAYS TD PT72
1.0000 | MEDICATED_PATCH | Freq: Once | TRANSDERMAL | Status: DC
  Administered 2015-04-13: 1.5 mg via TRANSDERMAL

## 2015-04-13 MED ORDER — FENTANYL CITRATE (PF) 100 MCG/2ML IJ SOLN
INTRAMUSCULAR | Status: AC
  Filled 2015-04-13: qty 2

## 2015-04-13 MED ORDER — MEPERIDINE HCL 25 MG/ML IJ SOLN: 6.2500 mg | INTRAMUSCULAR | Status: DC | PRN

## 2015-04-13 MED ORDER — HYDROCODONE-ACETAMINOPHEN 7.5-325 MG PO TABS: 1.0000 | ORAL_TABLET | Freq: Once | ORAL | Status: DC | PRN

## 2015-04-13 MED ORDER — FENTANYL CITRATE (PF) 100 MCG/2ML IJ SOLN
INTRAMUSCULAR | Status: DC | PRN
  Administered 2015-04-13 (×2): 50 ug via INTRAVENOUS

## 2015-04-13 MED ORDER — IBUPROFEN 600 MG PO TABS: 600.0000 mg | ORAL_TABLET | Freq: Four times a day (QID) | ORAL | Status: DC | PRN

## 2015-04-13 MED ORDER — LIDOCAINE HCL (CARDIAC) 20 MG/ML IV SOLN
INTRAVENOUS | Status: DC | PRN
  Administered 2015-04-13: 50 mg via INTRAVENOUS

## 2015-04-13 MED ORDER — MIDAZOLAM HCL 2 MG/2ML IJ SOLN
INTRAMUSCULAR | Status: DC | PRN
  Administered 2015-04-13: 2 mg via INTRAVENOUS

## 2015-04-13 MED ORDER — BUPIVACAINE HCL (PF) 0.5 % IJ SOLN
INTRAMUSCULAR | Status: AC
  Filled 2015-04-13: qty 30

## 2015-04-13 MED ORDER — OXYCODONE-ACETAMINOPHEN 5-325 MG PO TABS: 1.0000 | ORAL_TABLET | Freq: Four times a day (QID) | ORAL | Status: DC | PRN

## 2015-04-13 MED ORDER — LACTATED RINGERS IV SOLN
INTRAVENOUS | Status: DC
  Administered 2015-04-13 (×2): via INTRAVENOUS

## 2015-04-13 MED ORDER — KETOROLAC TROMETHAMINE 30 MG/ML IJ SOLN
30.0000 mg | Freq: Once | INTRAMUSCULAR | Status: AC
  Administered 2015-04-13: 30 mg via INTRAVENOUS

## 2015-04-13 MED ORDER — ONDANSETRON HCL 4 MG/2ML IJ SOLN
INTRAMUSCULAR | Status: AC
  Filled 2015-04-13: qty 2

## 2015-04-13 MED ORDER — PROPOFOL 10 MG/ML IV BOLUS
INTRAVENOUS | Status: DC | PRN
  Administered 2015-04-13: 200 mg via INTRAVENOUS

## 2015-04-13 MED ORDER — ONDANSETRON HCL 4 MG/2ML IJ SOLN
4.0000 mg | Freq: Once | INTRAMUSCULAR | Status: AC | PRN
  Administered 2015-04-13: 4 mg via INTRAVENOUS

## 2015-04-13 MED ORDER — FENTANYL CITRATE (PF) 100 MCG/2ML IJ SOLN
25.0000 ug | INTRAMUSCULAR | Status: DC | PRN
  Administered 2015-04-13: 50 ug via INTRAVENOUS

## 2015-04-13 MED ORDER — ONDANSETRON HCL 4 MG/2ML IJ SOLN
INTRAMUSCULAR | Status: DC | PRN
  Administered 2015-04-13: 4 mg via INTRAVENOUS

## 2015-04-13 MED ORDER — KETOROLAC TROMETHAMINE 30 MG/ML IJ SOLN
INTRAMUSCULAR | Status: AC
  Filled 2015-04-13: qty 1

## 2015-04-13 MED ORDER — MIDAZOLAM HCL 2 MG/2ML IJ SOLN
INTRAMUSCULAR | Status: AC
  Filled 2015-04-13: qty 2

## 2015-04-13 MED ORDER — DEXAMETHASONE SODIUM PHOSPHATE 4 MG/ML IJ SOLN
INTRAMUSCULAR | Status: AC
  Filled 2015-04-13: qty 1

## 2015-04-13 MED ORDER — BUPIVACAINE HCL 0.5 % IJ SOLN
INTRAMUSCULAR | Status: DC | PRN
  Administered 2015-04-13: 25 mL

## 2015-04-13 SURGICAL SUPPLY — 14 items
ABLATOR ENDOMETRIAL BIPOLAR (ABLATOR) ×2 IMPLANT
CLOTH BEACON ORANGE TIMEOUT ST (SAFETY) ×2 IMPLANT
GLOVE BIO SURGEON STRL SZ 6.5 (GLOVE) ×2 IMPLANT
GLOVE BIOGEL PI IND STRL 7.0 (GLOVE) ×1 IMPLANT
GLOVE BIOGEL PI INDICATOR 7.0 (GLOVE) ×1
GOWN STRL REUS W/TWL LRG LVL3 (GOWN DISPOSABLE) ×6 IMPLANT
NEEDLE SPNL 18GX3.5 QUINCKE PK (NEEDLE) ×2 IMPLANT
PACK VAGINAL MINOR WOMEN LF (CUSTOM PROCEDURE TRAY) ×2 IMPLANT
PAD OB MATERNITY 4.3X12.25 (PERSONAL CARE ITEMS) ×2 IMPLANT
SYR 30ML LL (SYRINGE) ×2 IMPLANT
TOWEL OR 17X24 6PK STRL BLUE (TOWEL DISPOSABLE) ×4 IMPLANT
TUBING AQUILEX INFLOW (TUBING) ×2 IMPLANT
TUBING AQUILEX OUTFLOW (TUBING) ×2 IMPLANT
WATER STERILE IRR 1000ML POUR (IV SOLUTION) ×2 IMPLANT

## 2015-04-13 NOTE — Transfer of Care (Signed)
Immediate Anesthesia Transfer of Care Note  Patient: Tonya Livingston  Procedure(s) Performed: Procedure(s): DILATATION & CURETTAGE/ WITH NOVASURE ABLATION (N/A)  Patient Location: PACU  Anesthesia Type:General  Level of Consciousness: awake, alert  and oriented  Airway & Oxygen Therapy: Patient Spontanous Breathing  Post-op Assessment: Report given to RN and Post -op Vital signs reviewed and stable  Post vital signs: Reviewed and stable  Last Vitals:  Filed Vitals:   04/13/15 0914  BP: 138/81  Pulse: 70  Temp: 36.6 C  Resp: 18    Complications: No apparent anesthesia complications

## 2015-04-13 NOTE — Discharge Instructions (Signed)
Dilation and Curettage or Vacuum Curettage, Care After Refer to this sheet in the next few weeks. These instructions provide you with information on caring for yourself after your procedure. Your health care provider may also give you more specific instructions. Your treatment has been planned according to current medical practices, but problems sometimes occur. Call your health care provider if you have any problems or questions after your procedure. WHAT TO EXPECT AFTER THE PROCEDURE After your procedure, it is typical to have light cramping and bleeding. This may last for 2 days to 2 weeks after the procedure. HOME CARE INSTRUCTIONS   Do not drive for 24 hours.  Wait 1 week before returning to strenuous activities.  Take your temperature 2 times a day for 4 days and write it down. Provide these temperatures to your health care provider if you develop a fever.  Avoid long periods of standing.  Avoid heavy lifting, pushing, or pulling. Do not lift anything heavier than 10 pounds (4.5 kg).  Limit stair climbing to once or twice a day.  Take rest periods often.  You may resume your usual diet.  Drink enough fluids to keep your urine clear or pale yellow.  Your usual bowel function should return. If you have constipation, you may:  Take a mild laxative with permission from your health care provider.  Add fruit and bran to your diet.  Drink more fluids.  Take showers instead of baths until your health care provider gives you permission to take baths.  Do not go swimming or use a hot tub until your health care provider approves.  Try to have someone with you or available to you the first 24-48 hours, especially if you were given a general anesthetic.  Do not douche, use tampons, or have sex (intercourse) for 2 weeks after the procedure.  Only take over-the-counter or prescription medicines as directed by your health care provider. Do not take aspirin. It can cause  bleeding.  Follow up with your health care provider as directed. SEEK MEDICAL CARE IF:   You have increasing cramps or pain that is not relieved with medicine.  You have abdominal pain that does not seem to be related to the same area of earlier cramping and pain.  You have bad smelling vaginal discharge.  You have a rash.  You are having problems with any medicine. SEEK IMMEDIATE MEDICAL CARE IF:   You have bleeding that is heavier than a normal menstrual period.  You have a fever.  You have chest pain.  You have shortness of breath.  You feel dizzy or feel like fainting.  You pass out.  You have pain in your shoulder strap area.  You have heavy vaginal bleeding with or without blood clots. MAKE SURE YOU:   Understand these instructions.  Will watch your condition.  Will get help right away if you are not doing well or get worse.   This information is not intended to replace advice given to you by your health care provider. Make sure you discuss any questions you have with your health care provider.   Document Released: 02/10/2000 Document Revised: 02/17/2013 Document Reviewed: 09/11/2012 Elsevier Interactive Patient Education 2016 [place]: D&C / D&E The following instructions have been prepared to help you care for yourself upon your return home.   Personal hygiene:  Use sanitary pads for vaginal drainage, not tampons.  Shower the day after your procedure.  NO tub baths, pools or Jacuzzis for 2-3 weeks.  Wipe front to back after using the bathroom.  Activity and limitations:  Do NOT drive or operate any equipment for 24 hours. The effects of anesthesia are still present and drowsiness may result.  Do NOT rest in bed all day.  Walking is encouraged.  Walk up and down stairs slowly.  You may resume your normal activity in one to two days or as indicated by your physician.  Sexual activity: NO intercourse for at least 2  weeks after the procedure, or as indicated by your physician.  Diet: Eat a light meal as desired this evening. You may resume your usual diet tomorrow.  Return to work: You may resume your work activities in one to two days or as indicated by your doctor.  What to expect after your surgery: Expect to have vaginal bleeding/discharge for 2-3 days and spotting for up to 10 days. It is not unusual to have soreness for up to 1-2 weeks. You may have a slight burning sensation when you urinate for the first day. Mild cramps may continue for a couple of days. You may have a regular period in 2-6 weeks.  Call your doctor for any of the following:  Excessive vaginal bleeding, saturating and changing one pad every hour.  Inability to urinate 6 hours after discharge from hospital.  Pain not relieved by pain medication.  Fever of 100.4 F or greater.  Unusual vaginal discharge or odor.   Call for an appointment:    Patients signature: ______________________  Nurses signature ________________________  Support person's signature_______________________

## 2015-04-13 NOTE — Anesthesia Preprocedure Evaluation (Addendum)
Anesthesia Evaluation  Patient identified by MRN, date of birth, ID band Patient awake    Reviewed: Allergy & Precautions, NPO status , Patient's Chart, lab work & pertinent test results  History of Anesthesia Complications (+) PONV  Airway Mallampati: II  TM Distance: >3 FB Neck ROM: Full    Dental no notable dental hx.    Pulmonary neg pulmonary ROS,    Pulmonary exam normal breath sounds clear to auscultation       Cardiovascular negative cardio ROS Normal cardiovascular exam Rhythm:Regular Rate:Normal     Neuro/Psych negative neurological ROS  negative psych ROS   GI/Hepatic negative GI ROS, Neg liver ROS,   Endo/Other  negative endocrine ROS  Renal/GU negative Renal ROS     Musculoskeletal negative musculoskeletal ROS (+)   Abdominal (+) + obese,   Peds  Hematology negative hematology ROS (+) anemia ,   Anesthesia Other Findings   Reproductive/Obstetrics negative OB ROS                            Anesthesia Physical Anesthesia Plan  ASA: II  Anesthesia Plan: General   Post-op Pain Management:    Induction: Intravenous  Airway Management Planned: LMA  Additional Equipment:   Intra-op Plan:   Post-operative Plan: Extubation in OR  Informed Consent: I have reviewed the patients History and Physical, chart, labs and discussed the procedure including the risks, benefits and alternatives for the proposed anesthesia with the patient or authorized representative who has indicated his/her understanding and acceptance.   Dental advisory given  Plan Discussed with: CRNA  Anesthesia Plan Comments:        Anesthesia Quick Evaluation

## 2015-04-13 NOTE — H&P (Signed)
Tonya Livingston is an 40 y.o. S AA P4 (24, 21, 75, and 71 yo kids, 3 grands) here today to have a d&c with endometrial ablation.  She has had heavy long periods and her hbg got down to 8.5. She is taking iron BID. Her hbg is currently 9.8. Her u/s shows possible adenomyosis. Her embx was benign. She has been on megace. Her bleeding has slowed down on megace but is still happening every day.    No LMP recorded.    Past Medical History  Diagnosis Date  . Medical history non-contributory   . Anemia   . Vaginal delivery 1998    Past Surgical History  Procedure Laterality Date  . Tubal ligation    . Diagnostic laparoscopy with removal of ectopic pregnancy  1997    Family History  Problem Relation Age of Onset  . Diabetes Maternal Aunt   . Diabetes Maternal Grandmother   . Hypertension Maternal Grandmother     Social History:  reports that she has never smoked. She has never used smokeless tobacco. She reports that she does not drink alcohol or use illicit drugs.  Allergies: No Known Allergies  Prescriptions prior to admission  Medication Sig Dispense Refill Last Dose  . ferrous sulfate (FERROUSUL) 325 (65 FE) MG tablet Take 1 tablet (325 mg total) by mouth 2 (two) times daily. 60 tablet 1 Past Month at Unknown time  . ibuprofen (ADVIL,MOTRIN) 800 MG tablet Take 1 tablet (800 mg total) by mouth every 8 (eight) hours as needed. (Patient taking differently: Take 800 mg by mouth every 8 (eight) hours as needed for mild pain. ) 30 tablet 0 04/11/2015    ROS  Blood pressure 138/81, pulse 70, temperature 97.9 F (36.6 C), temperature source Oral, resp. rate 18, height 5\' 6"  (1.676 m), weight 88.905 kg (196 lb), SpO2 100 %. Physical Exam Heart- rrr Lungs- CTAB Abd- benign Results for orders placed or performed during the hospital encounter of 04/13/15 (from the past 24 hour(s))  Pregnancy, urine     Status: None   Collection Time: 04/13/15  9:26 AM  Result Value Ref Range   Preg  Test, Ur NEGATIVE NEGATIVE  CBC     Status: Abnormal   Collection Time: 04/13/15  9:26 AM  Result Value Ref Range   WBC 6.2 4.0 - 10.5 K/uL   RBC 4.26 3.87 - 5.11 MIL/uL   Hemoglobin 9.8 (L) 12.0 - 15.0 g/dL   HCT 33.0 (L) 36.0 - 46.0 %   MCV 77.5 (L) 78.0 - 100.0 fL   MCH 23.0 (L) 26.0 - 34.0 pg   MCHC 29.7 (L) 30.0 - 36.0 g/dL   RDW 17.2 (H) 11.5 - 15.5 %   Platelets 337 150 - 400 K/uL    No results found.  Assessment/Plan: DUB/anemia- plan for d&c and endometrial ablation.  She understands the risks of surgery, including, but not to infection, bleeding, DVTs, damage to bowel, bladder, ureters. She wishes to proceed.     Khianna Blazina C. 04/13/2015, 10:05 AM

## 2015-04-13 NOTE — Anesthesia Procedure Notes (Signed)
Procedure Name: LMA Insertion Date/Time: 04/13/2015 10:46 AM Performed by: Bufford Spikes Pre-anesthesia Checklist: Patient identified, Timeout performed, Emergency Drugs available, Suction available and Patient being monitored Patient Re-evaluated:Patient Re-evaluated prior to inductionOxygen Delivery Method: Circle system utilized Preoxygenation: Pre-oxygenation with 100% oxygen Intubation Type: IV induction Ventilation: Mask ventilation without difficulty LMA: LMA inserted LMA Size: 4.0 Tube type: Oral Number of attempts: 1 Placement Confirmation: positive ETCO2 and breath sounds checked- equal and bilateral Tube secured with: Tape Dental Injury: Teeth and Oropharynx as per pre-operative assessment

## 2015-04-13 NOTE — Op Note (Signed)
04/13/2015  11:15 AM  PATIENT:  Tonya Livingston  40 y.o. female  PRE-OPERATIVE DIAGNOSIS:  DYSFUNCTIONAL UTERINE BLEEDING , ANEMIA  POST-OPERATIVE DIAGNOSIS:  same  PROCEDURE:  Procedure(s): DILATATION & CURETTAGE/ WITH NOVASURE ABLATION (N/A)  SURGEON:  Surgeon(s) and Role:    * Emily Filbert, MD - Primary  ANESTHESIA:   general and paracervical block  EBL:  Total I/O In: 1000 [I.V.:1000] Out: 5 [Blood:5]  BLOOD ADMINISTERED:none  DRAINS: none   LOCAL MEDICATIONS USED:  MARCAINE     SPECIMEN:  Source of Specimen:  uterine curettings  DISPOSITION OF SPECIMEN:  PATHOLOGY  COUNTS:  YES  TOURNIQUET:  * No tourniquets in log *  DICTATION: .Dragon Dictation  PLAN OF CARE: Discharge to home after PACU  PATIENT DISPOSITION:  PACU - hemodynamically stable.   Delay start of Pharmacological VTE agent (>24hrs) due to surgical blood loss or risk of bleeding: not applicable    The risks, benefits, and alternatives of surgery were explained, understood, and accepted. All questions were answered. Consents were signed. In the operating room general anesthesia was applied without complication, and she was placed in the dorsal lithotomy position. Her vagina was prepped and draped in the usual sterile fashion.  A bimanual exam revealed a  Normal size and shape, retroverted and retroflexed mobile uterus. Her adnexa were nonenlarged. A speculum was placed and a single-tooth tenaculum was used to grasp the posterior lip of her cervix. A total of 30 mL of 0.5% Marcaine was used to perform a paracervical block. Her uterus sounded to 9 cm. Her cervix was carefully and slowly dilated to accommodate a small curette. A curettage was done in all quadrants and the fundus of the uterus. A moderate amount of   tissue was obtained. A gritty sensation was appreciated throughout. I then proceeded with the Novasure ablation. Her cavity length was 4 cm and the width was 3 cm. It passed its test and ran without  incident. There was bleeding from the needle site (from the paracervical block). Pressure and silver nitrate did not yield hemostasis so I used a 3-0 vicryl suture to place a figure of eight suture. Excellent hemostasis was noted.  She was taken to the recovery room after being extubated. She tolerated the procedure well.

## 2015-04-13 NOTE — Anesthesia Postprocedure Evaluation (Signed)
Anesthesia Post Note  Patient: Tonya Livingston  Procedure(s) Performed: Procedure(s) (LRB): DILATATION & CURETTAGE/ WITH NOVASURE ABLATION (N/A)  Patient location during evaluation: PACU Anesthesia Type: General Level of consciousness: awake Pain management: pain level controlled Vital Signs Assessment: post-procedure vital signs reviewed and stable Respiratory status: spontaneous breathing Cardiovascular status: stable Postop Assessment: no signs of nausea or vomiting Anesthetic complications: no    Last Vitals:  Filed Vitals:   04/13/15 1215 04/13/15 1230  BP: 135/90   Pulse: 66 65  Temp:    Resp: 18 17    Last Pain: There were no vitals filed for this visit.               [place]

## 2015-04-14 ENCOUNTER — Encounter (HOSPITAL_COMMUNITY): Payer: Self-pay | Admitting: Obstetrics & Gynecology

## 2015-04-27 ENCOUNTER — Encounter: Payer: Self-pay | Admitting: *Deleted

## 2015-11-22 ENCOUNTER — Encounter (HOSPITAL_COMMUNITY): Payer: Self-pay | Admitting: *Deleted

## 2015-11-22 ENCOUNTER — Inpatient Hospital Stay (HOSPITAL_COMMUNITY)
Admission: AD | Admit: 2015-11-22 | Discharge: 2015-11-22 | Disposition: A | Discharge status: Home / Self Care | Payer: PRIVATE HEALTH INSURANCE | Source: Ambulatory Visit | Attending: Family Medicine | Admitting: Family Medicine

## 2015-11-22 DIAGNOSIS — R109 Unspecified abdominal pain: Secondary | ICD-10-CM

## 2015-11-22 DIAGNOSIS — N939 Abnormal uterine and vaginal bleeding, unspecified: Secondary | ICD-10-CM | POA: Insufficient documentation

## 2015-11-22 LAB — URINALYSIS, ROUTINE W REFLEX MICROSCOPIC
BILIRUBIN URINE: NEGATIVE
GLUCOSE, UA: NEGATIVE mg/dL
Ketones, ur: 15 mg/dL — AB
Nitrite: NEGATIVE
Protein, ur: NEGATIVE mg/dL
Specific Gravity, Urine: 1.02 (ref 1.005–1.030)
pH: 6.5 (ref 5.0–8.0)

## 2015-11-22 LAB — URINE MICROSCOPIC-ADD ON

## 2015-11-22 LAB — CBC
HCT: 36.9 % (ref 36.0–46.0)
Hemoglobin: 12.1 g/dL (ref 12.0–15.0)
MCH: 26.5 pg (ref 26.0–34.0)
MCHC: 32.8 g/dL (ref 30.0–36.0)
MCV: 80.7 fL (ref 78.0–100.0)
PLATELETS: 278 10*3/uL (ref 150–400)
RBC: 4.57 MIL/uL (ref 3.87–5.11)
RDW: 15.4 % (ref 11.5–15.5)
WBC: 5.8 10*3/uL (ref 4.0–10.5)

## 2015-11-22 LAB — POCT PREGNANCY, URINE: Preg Test, Ur: NEGATIVE

## 2015-11-22 MED ORDER — IBUPROFEN 800 MG PO TABS
800.0000 mg | ORAL_TABLET | Freq: Once | ORAL | Status: AC
  Administered 2015-11-22: 800 mg via ORAL
  Filled 2015-11-22: qty 1

## 2015-11-22 MED ORDER — NAPROXEN 500 MG PO TABS: 500.0000 mg | ORAL_TABLET | Freq: Two times a day (BID) | ORAL | 0 refills | Status: DC

## 2015-11-22 NOTE — MAU Provider Note (Signed)
History     CSN: [id number]  Arrival date and time: 11/22/15 [id number]   First Provider Initiated Contact with Patient 11/22/15 1042      Chief Complaint  Patient presents with  . Vaginal Bleeding  . Abdominal Pain   HPI  Tonya Livingston is a 40 y.o. female who presents for abdominal pain & vaginal bleeding. Symptoms began yesterday. Reports lower abdominal cramping that is constant. Rates pain 10/10. Has not take medication for pain. Used heating pad last night with some relief.  Vaginal bleeding began yesterday as well and was lighter than menses until this morning. Reports increase in bleeding with some small clots. Not saturating pads. Dr. Hulan Fray performed ablation on her in February of this year; she has not had follow up with her. This is her second episode of bleeding since the procedure.   OB History    Gravida Para Term Preterm AB Living   5 3 3  0 1 4   SAB TAB Ectopic Multiple Live Births   0 0 1 0        Past Medical History:  Diagnosis Date  . Anemia   . Vaginal delivery 1998    Past Surgical History:  Procedure Laterality Date  . DIAGNOSTIC LAPAROSCOPY WITH REMOVAL OF ECTOPIC PREGNANCY  1997  . DILITATION & CURRETTAGE/HYSTROSCOPY WITH NOVASURE ABLATION N/A 04/13/2015   Procedure: DILATATION & CURETTAGE/ WITH NOVASURE ABLATION;  Surgeon: Emily Filbert, MD;  Location: [facility];  Service: Gynecology;  Laterality: N/A;  . TUBAL LIGATION      Family History  Problem Relation Age of Onset  . Diabetes Maternal Aunt   . Diabetes Maternal Grandmother   . Hypertension Maternal Grandmother     Social History  Substance Use Topics  . Smoking status: Never Smoker  . Smokeless tobacco: Never Used  . Alcohol use No    Allergies: No Known Allergies  No prescriptions prior to admission.    Review of Systems  Constitutional: Negative.   Gastrointestinal: Positive for abdominal pain. Negative for constipation, diarrhea, nausea and vomiting.  Genitourinary: Negative for  dysuria.       + vaginal bleeding   Physical Exam   Blood pressure 118/78, pulse 74, temperature 98.1 F (36.7 C), temperature source Oral, resp. rate 18, weight 194 lb 3.2 oz (88.1 kg), last menstrual period 11/21/2015.  Physical Exam  Nursing note and vitals reviewed. Constitutional: She is oriented to person, place, and time. She appears well-developed and well-nourished. No distress.  HENT:  Head: Normocephalic and atraumatic.  Eyes: Conjunctivae are normal. Right eye exhibits no discharge. Left eye exhibits no discharge. No scleral icterus.  Neck: Normal range of motion.  Cardiovascular: Normal rate.   Respiratory: Effort normal. No respiratory distress.  GI: Soft. She exhibits no distension. There is no tenderness. There is no rebound and no guarding.  Genitourinary: Uterus normal. Cervix exhibits no motion tenderness. Right adnexum displays no mass and no tenderness. Left adnexum displays no mass and no tenderness.  Genitourinary Comments: Small amount of dark red blood. No active bleeding  Neurological: She is alert and oriented to person, place, and time.  Skin: Skin is warm and dry. She is not diaphoretic.  Psychiatric: She has a normal mood and affect. Her behavior is normal. Judgment and thought content normal.    MAU Course  Procedures Results for orders placed or performed during the hospital encounter of 11/22/15 (from the past 24 hour(s))  Urinalysis, Routine w reflex microscopic (not at [facility])  Status: Abnormal   Collection Time: 11/22/15 10:10 AM  Result Value Ref Range   Color, Urine YELLOW YELLOW   APPearance HAZY (A) CLEAR   Specific Gravity, Urine 1.020 1.005 - 1.030   pH 6.5 5.0 - 8.0   Glucose, UA NEGATIVE NEGATIVE mg/dL   Hgb urine dipstick LARGE (A) NEGATIVE   Bilirubin Urine NEGATIVE NEGATIVE   Ketones, ur 15 (A) NEGATIVE mg/dL   Protein, ur NEGATIVE NEGATIVE mg/dL   Nitrite NEGATIVE NEGATIVE   Leukocytes, UA TRACE (A) NEGATIVE  Urine  microscopic-add on     Status: Abnormal   Collection Time: 11/22/15 10:10 AM  Result Value Ref Range   Squamous Epithelial / LPF 0-5 (A) NONE SEEN   WBC, UA 0-5 0 - 5 WBC/hpf   RBC / HPF TOO NUMEROUS TO COUNT 0 - 5 RBC/hpf   Bacteria, UA RARE (A) NONE SEEN  Pregnancy, urine POC     Status: None   Collection Time: 11/22/15 10:13 AM  Result Value Ref Range   Preg Test, Ur NEGATIVE NEGATIVE  CBC     Status: None   Collection Time: 11/22/15 10:24 AM  Result Value Ref Range   WBC 5.8 4.0 - 10.5 K/uL   RBC 4.57 3.87 - 5.11 MIL/uL   Hemoglobin 12.1 12.0 - 15.0 g/dL   HCT 36.9 36.0 - 46.0 %   MCV 80.7 78.0 - 100.0 fL   MCH 26.5 26.0 - 34.0 pg   MCHC 32.8 30.0 - 36.0 g/dL   RDW 15.4 11.5 - 15.5 %   Platelets 278 150 - 400 K/uL    MDM UPT negative CBC -- hemoglobin 12.1 VSS Ibuprofen 800 mg PO   Assessment and Plan  A: 1. Abnormal uterine bleeding (AUB)   2. Abdominal cramping     P: Discharge home  Rx naproxen Schedule f/u with Dr. Hulan Fray if bleeding continues 2+ weeks   Jorje Guild 11/22/2015, 10:41 AM

## 2015-11-22 NOTE — Discharge Instructions (Signed)

## 2015-11-22 NOTE — MAU Note (Signed)
Dysfunctional uterine bleeding and she is hurting.  Bleeding started yesterday.  Changing every couple hours.  Had ablation in February.  This bleeding and pain is different

## 2016-01-13 ENCOUNTER — Ambulatory Visit: Payer: PRIVATE HEALTH INSURANCE | Admitting: Obstetrics & Gynecology

## 2017-03-21 IMAGING — US US TRANSVAGINAL NON-OB
1 series · 15 of 25 positions shown · non-contrast
Comparison: 01/09/2006.

CLINICAL DATA: Thickened endometrium.  Abnormal uterine bleeding.

EXAM:
ULTRASOUND PELVIS TRANSVAGINAL
TECHNIQUE: Transvaginal ultrasound examination of the pelvis was performed
including evaluation of the uterus, ovaries, adnexal regions, and
pelvic cul-de-sac.
..

[Series 1: us transvaginal non-ob · 41 acquisitions, 15 frames shown]
[im 1/41]
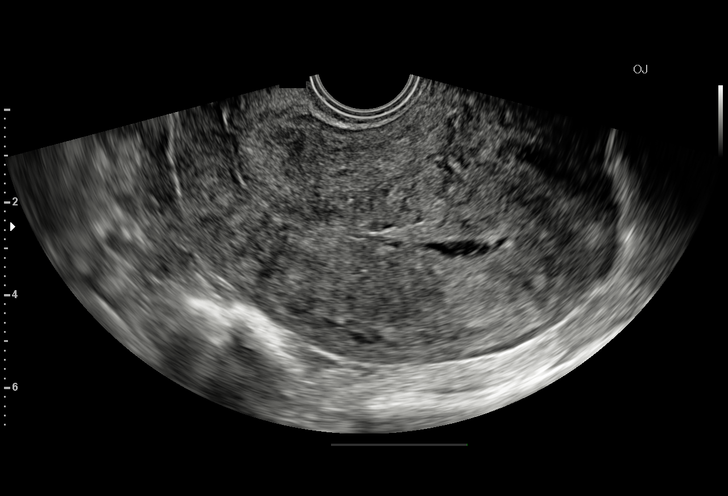
[im 4/41]
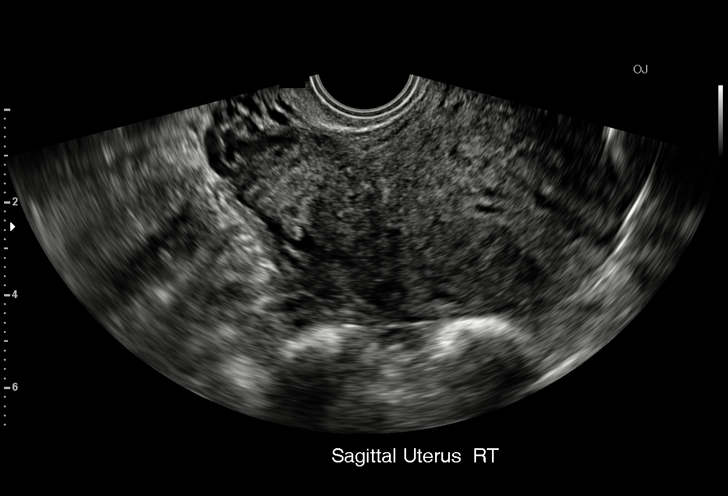
[im 7/41]
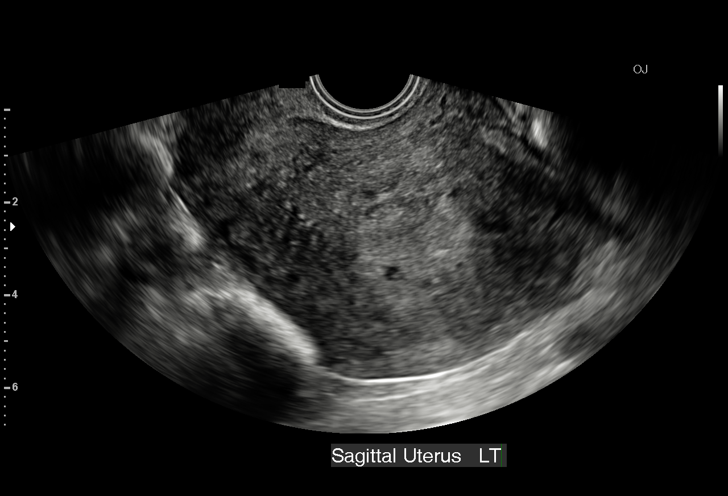
[im 9/41]
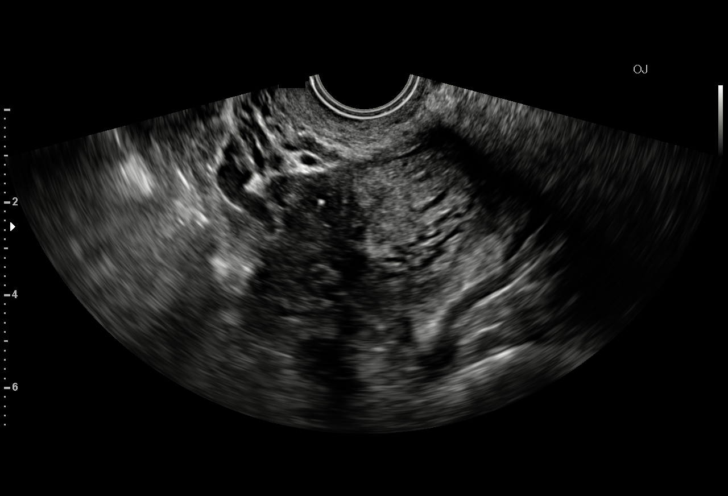
[im 12/41]
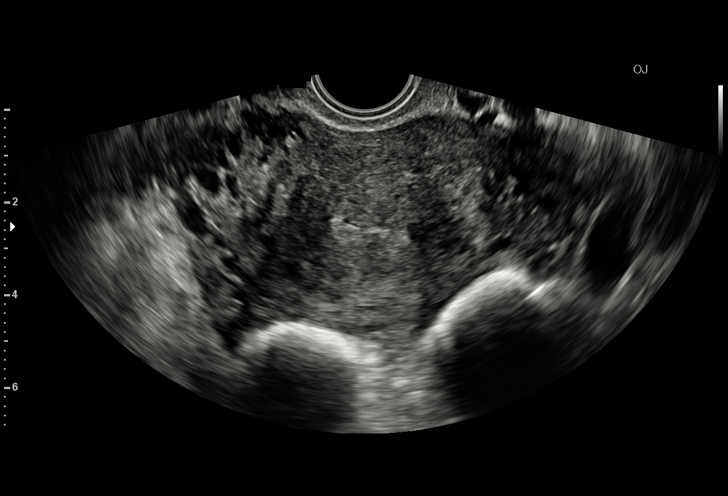
[im 16/41]
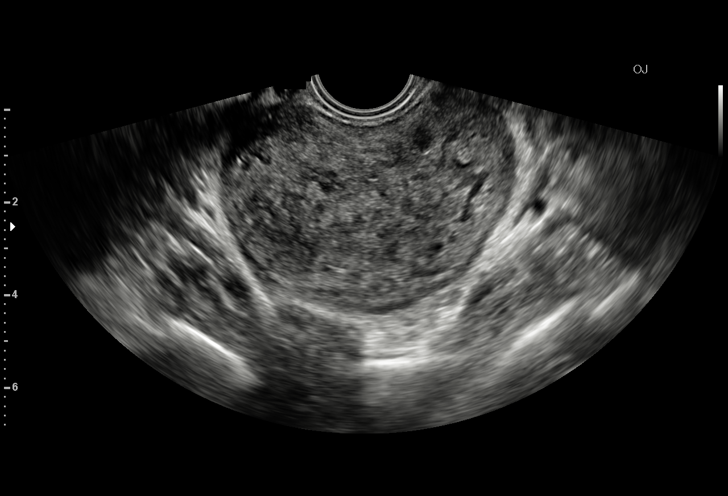
[im 17/41]
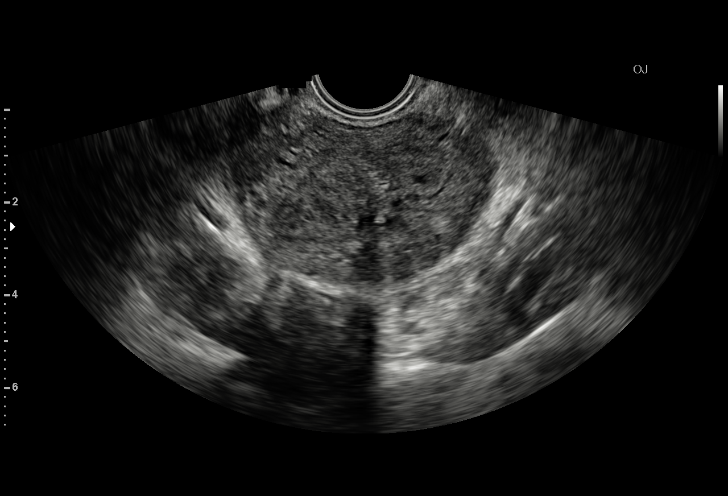
[im 21/41]
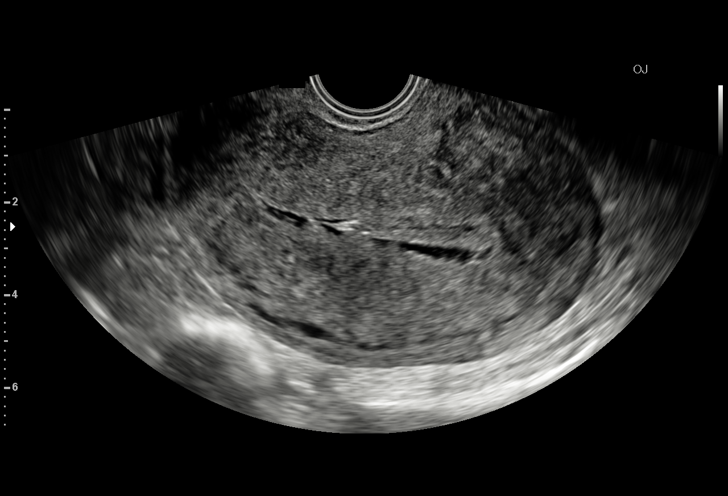
[im 24/41]
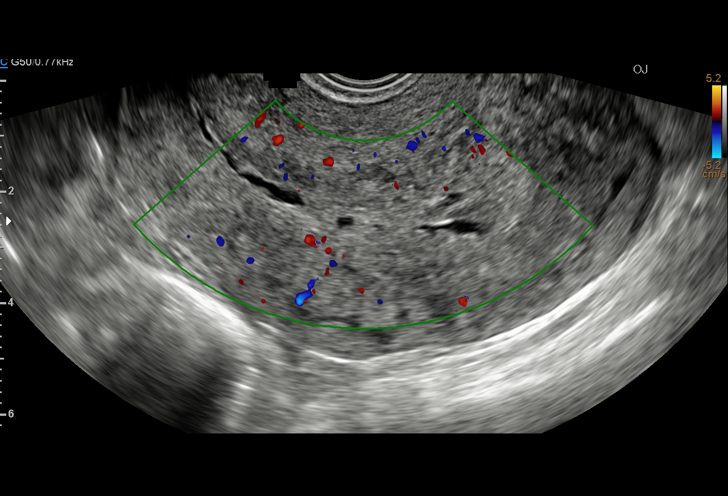
[im 26/41]
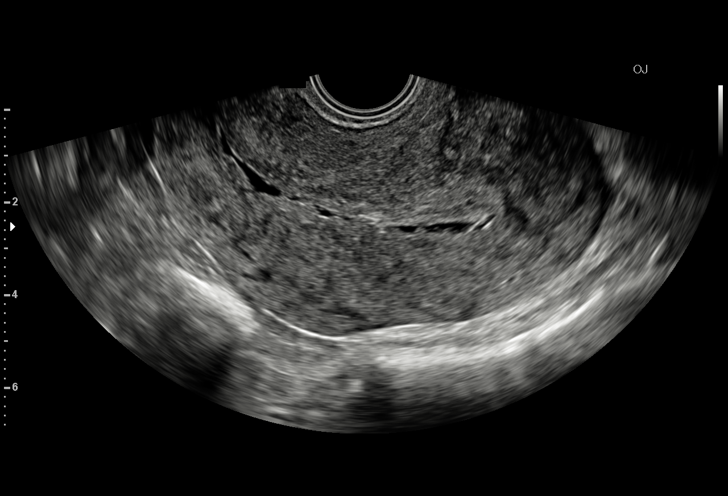
[im 29/41]
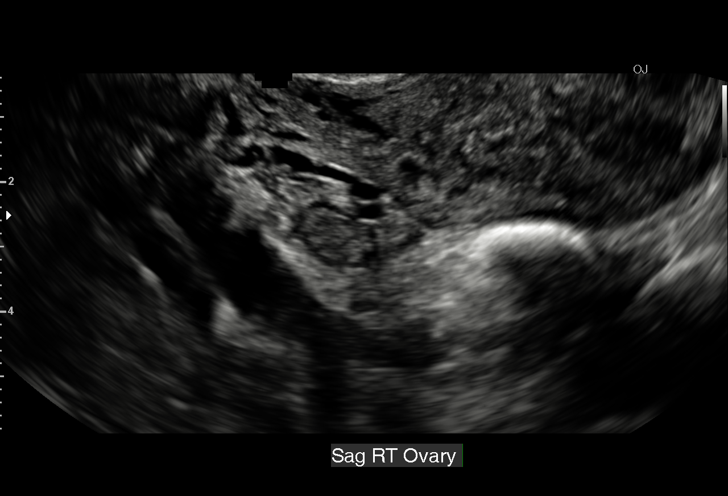
[im 32/41]
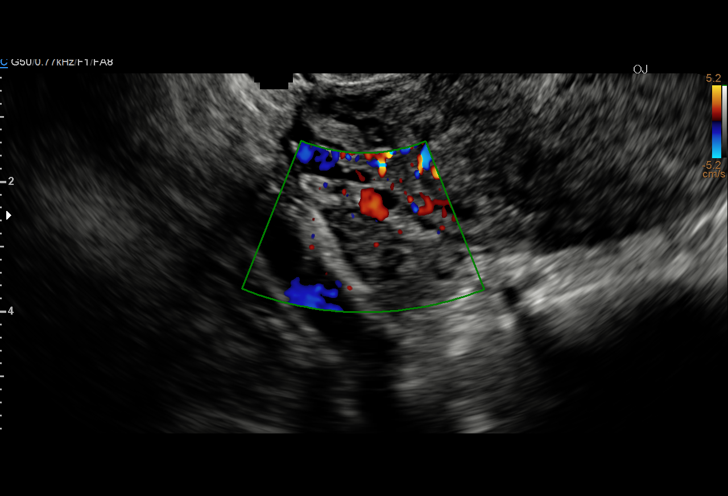
[im 34/41]
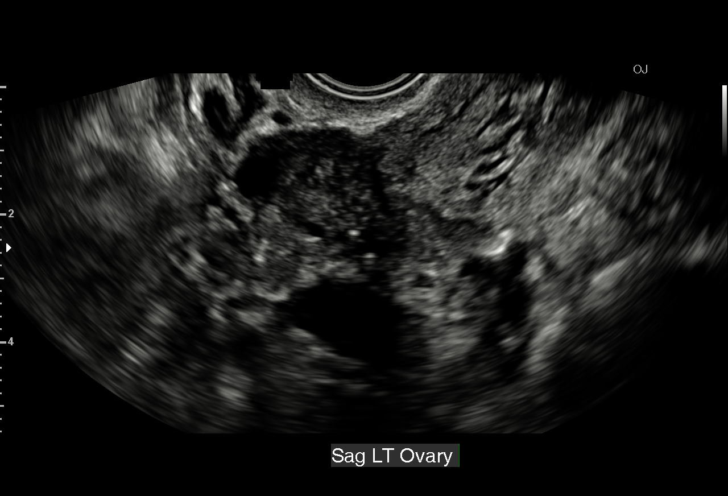
[im 37/41]
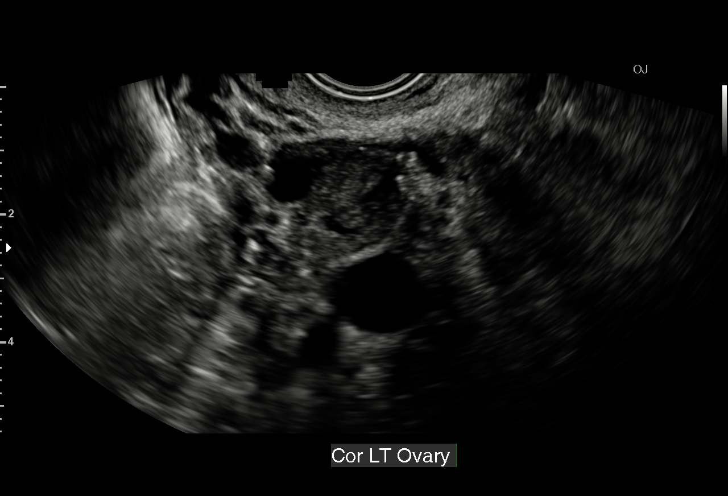
[im 41/41]
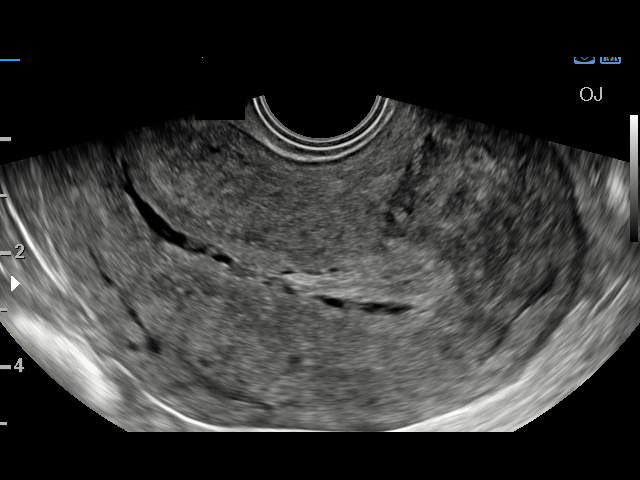

[15 of 25 positions shown; findings below may reference images not displayed]

FINDINGS: Uterus

Measurements: 10.8 x 5.8 x 8.5 cm. Heterogeneous echotexture.
Adenomyosis cannot be excluded. No mass.

Endometrium

Thickness: 14.2 mm.  Heterogeneous echotexture.

Right ovary

Measurements: 1.6 x 1.1 x 10.9 cm. Normal appearance/no adnexal
mass.

Left ovary

Measurements: 2.2 x 1.9 x 2.1 cm. Normal appearance/no adnexal mass.

Other findings:  No abnormal free fluid.
IMPRESSION: Endometrium is prominent at 14.2 mm. Endometrial has a heterogeneous
echotexture. If bleeding remains unresponsive to hormonal or medical
therapy, sonohysterogram should be considered for focal lesion
work-up. (Ref: Radiological Reasoning: Algorithmic Workup of
Abnormal Vaginal Bleeding with Endovaginal Sonography and
Sonohysterography. AJR [id number]; 191:[id number])

## 2020-01-07 ENCOUNTER — Encounter: Payer: Self-pay | Admitting: Gastroenterology

## 2020-02-05 ENCOUNTER — Encounter: Payer: Self-pay | Admitting: *Deleted

## 2020-02-23 ENCOUNTER — Other Ambulatory Visit: Payer: Self-pay

## 2020-02-23 ENCOUNTER — Ambulatory Visit (AMBULATORY_SURGERY_CENTER): Payer: Self-pay | Admitting: *Deleted

## 2020-02-23 VITALS — Ht 66.0 in | Wt 217.0 lb

## 2020-02-23 DIAGNOSIS — Z1211 Encounter for screening for malignant neoplasm of colon: Secondary | ICD-10-CM

## 2020-02-23 NOTE — Progress Notes (Signed)
No egg or soy allergy known to patient  No issues with past sedation with any surgeries or procedures No intubation problems in the past  No FH of Malignant Hyperthermia No diet pills per patient No home 02 use per patient  No blood thinners per patient   Pt states  issues with constipation if eats dairy in large amounts- no daily BM- stools vary hard to soft -  Yesterday formed stools but not hard- will go 2-3 days between BM's - no bleeding- does not take meds to go- this is a life long cycle - wil;l do a 2 day Miralax prep after discussion with pt-   Pt states she has severe anxiety with IV sticks- her BP elevates along with her HR- she does not pass out - prefers AC stick than hand   No A fib or A flutter  EMMI video to pt or via MyChart  COVID 19 guidelines implemented in [facility] today with Pt and RN  Pt is fully vaccinated  for Covid   Due to the COVID-19 pandemic we are asking patients to follow certain guidelines.  Pt aware of COVID protocols and [facility] guidelines

## 2020-03-03 ENCOUNTER — Encounter: Payer: Self-pay | Admitting: Gastroenterology

## 2020-03-08 ENCOUNTER — Other Ambulatory Visit: Payer: Self-pay

## 2020-03-08 ENCOUNTER — Ambulatory Visit (AMBULATORY_SURGERY_CENTER): Payer: Managed Care, Other (non HMO) | Admitting: Gastroenterology

## 2020-03-08 ENCOUNTER — Encounter: Payer: Self-pay | Admitting: Gastroenterology

## 2020-03-08 VITALS — BP 129/83 | HR 66 | Temp 98.6°F | Resp 20 | Ht 66.0 in | Wt 217.0 lb

## 2020-03-08 DIAGNOSIS — K635 Polyp of colon: Secondary | ICD-10-CM | POA: Diagnosis not present

## 2020-03-08 DIAGNOSIS — D125 Benign neoplasm of sigmoid colon: Secondary | ICD-10-CM

## 2020-03-08 DIAGNOSIS — D124 Benign neoplasm of descending colon: Secondary | ICD-10-CM

## 2020-03-08 DIAGNOSIS — Z1211 Encounter for screening for malignant neoplasm of colon: Secondary | ICD-10-CM

## 2020-03-08 MED ORDER — SODIUM CHLORIDE 0.9 % IV SOLN: 500.0000 mL | Freq: Once | INTRAVENOUS | Status: AC

## 2020-03-08 NOTE — Progress Notes (Signed)
Called to room to assist during endoscopic procedure.  Patient ID and intended procedure confirmed with present staff. Received instructions for my participation in the procedure from the performing physician.  

## 2020-03-08 NOTE — Progress Notes (Unsigned)
Vital signs by CW.  

## 2020-03-08 NOTE — Progress Notes (Signed)
Report to PACU, RN, vss, BBS= Clear.  

## 2020-03-08 NOTE — Patient Instructions (Signed)
YOU HAD AN ENDOSCOPIC PROCEDURE TODAY AT [facility]:   Refer to the procedure report that was given to you for any specific questions about what was found during the examination.  If the procedure report does not answer your questions, please call your gastroenterologist to clarify.  If you requested that your care partner not be given the details of your procedure findings, then the procedure report has been included in a sealed envelope for you to review at your convenience later.  YOU SHOULD EXPECT: Some feelings of bloating in the abdomen. Passage of more gas than usual.  Walking can help get rid of the air that was put into your GI tract during the procedure and reduce the bloating. If you had a lower endoscopy (such as a colonoscopy or flexible sigmoidoscopy) you may notice spotting of blood in your stool or on the toilet paper. If you underwent a bowel prep for your procedure, you may not have a normal bowel movement for a few days.  Please Note:  You might notice some irritation and congestion in your nose or some drainage.  This is from the oxygen used during your procedure.  There is no need for concern and it should clear up in a day or so.  SYMPTOMS TO REPORT IMMEDIATELY:   Following lower endoscopy (colonoscopy or flexible sigmoidoscopy):  Excessive amounts of blood in the stool  Significant tenderness or worsening of abdominal pains  Swelling of the abdomen that is new, acute  Fever of 100F or higher   For urgent or emergent issues, a gastroenterologist can be reached at any hour by calling [phone]. Do not use MyChart messaging for urgent concerns.    DIET:  We do recommend a small meal at first, but then you may proceed to your regular diet.  Drink plenty of fluids but you should avoid alcoholic beverages for 24 hours. Follow a High Fiber Diet (see handout given to you by your recovery nurse).  MEDICATIONS: Continue present medications. Use FiberCon 1-2  tablets by mouth daily.  Please see handouts given to you by your recovery nurse.  ACTIVITY:  You should plan to take it easy for the rest of today and you should NOT DRIVE or use heavy machinery until tomorrow (because of the sedation medicines used during the test).    FOLLOW UP: Our staff will call the number listed on your records 48-72 hours following your procedure to check on you and address any questions or concerns that you may have regarding the information given to you following your procedure. If we do not reach you, we will leave a message.  We will attempt to reach you two times.  During this call, we will ask if you have developed any symptoms of COVID 19. If you develop any symptoms (ie: fever, flu-like symptoms, shortness of breath, cough etc.) before then, please call [phone].  If you test positive for Covid 19 in the 2 weeks post procedure, please call and report this information to [place].    If any biopsies were taken you will be contacted by phone or by letter within the next 1-3 weeks.  Please call us at [phone] if you have not heard about the biopsies in 3 weeks.   Thank you for allowing [place] to provide for your healthcare needs today.   SIGNATURES/CONFIDENTIALITY: You and/or your care partner have signed paperwork which will be entered into your electronic medical record.  These signatures attest to the fact  that that the information above on your After Visit Summary has been reviewed and is understood.  Full responsibility of the confidentiality of this discharge information lies with you and/or your care-partner.

## 2020-03-08 NOTE — Op Note (Signed)
[facility] Patient Name: Tonya Livingston Procedure Date: 03/08/2020 9:08 AM MRN: [id number] Endoscopist: Justice Britain , MD Age: 45 Referring MD:  Date of Birth: Apr 05, 1975 Gender: Female Account #: [id number] Procedure:                Colonoscopy Indications:              Screening for colorectal malignant neoplasm, This                            is the patient's first colonoscopy Medicines:                Monitored Anesthesia Care Procedure:                Pre-Anesthesia Assessment:                           - Prior to the procedure, a History and Physical                            was performed, and patient medications and                            allergies were reviewed. The patient's tolerance of                            previous anesthesia was also reviewed. The risks                            and benefits of the procedure and the sedation                            options and risks were discussed with the patient.                            All questions were answered, and informed consent                            was obtained. Prior Anticoagulants: The patient has                            taken no previous anticoagulant or antiplatelet                            agents. ASA Grade Assessment: II - A patient with                            mild systemic disease. After reviewing the risks                            and benefits, the patient was deemed in                            satisfactory condition to undergo the procedure.  After obtaining informed consent, the colonoscope                            was passed under direct vision. Throughout the                            procedure, the patient's blood pressure, pulse, and                            oxygen saturations were monitored continuously. The                            Olympus CF-[id number] (#[id number]) Colonoscope was                            introduced through the  anus and advanced to the 5                            cm into the ileum. The colonoscopy was performed                            without difficulty. The patient tolerated the                            procedure. The quality of the bowel preparation was                            adequate. The terminal ileum, ileocecal valve,                            appendiceal orifice, and rectum were photographed. Scope In: 9:22:28 AM Scope Out: 9:36:09 AM Scope Withdrawal Time: 0 hours 8 minutes 53 seconds  Total Procedure Duration: 0 hours 13 minutes 41 seconds  Findings:                 The digital rectal exam findings include                            hemorrhoids. Pertinent negatives include no                            palpable rectal lesions.                           The terminal ileum and ileocecal valve appeared                            normal.                           Two sessile polyps were found in the recto-sigmoid                            colon and descending colon. The polyps were 2 to 4  mm in size. These polyps were removed with a cold                            snare. Resection and retrieval were complete.                           Normal mucosa was found in the entire colon                            otherwise.                           Non-bleeding non-thrombosed internal hemorrhoids                            were found during retroflexion, during perianal                            exam and during digital exam. The hemorrhoids were                            Grade II (internal hemorrhoids that prolapse but                            reduce spontaneously). Complications:            No immediate complications. Estimated Blood Loss:     Estimated blood loss was minimal. Impression:               - Hemorrhoids found on digital rectal exam.                           - The examined portion of the ileum was normal.                           - Two 2  to 4 mm polyps at the recto-sigmoid colon                            and in the descending colon, removed with a cold                            snare. Resected and retrieved.                           - Diverticulosis in the ascending colon.                           - Normal mucosa in the entire examined colon.                           - Non-bleeding non-thrombosed internal hemorrhoids. Recommendation:           - The patient will be observed post-procedure,                            until all discharge  criteria are met.                           - Discharge patient to home.                           - Patient has a contact number available for                            emergencies. The signs and symptoms of potential                            delayed complications were discussed with the                            patient. Return to normal activities tomorrow.                            Written discharge instructions were provided to the                            patient.                           - High fiber diet.                           - Use FiberCon 1-2 tablets PO daily.                           - Continue present medications.                           - Await pathology results.                           - Repeat colonoscopy in 7/10 years for surveillance                            based on pathology results and findings of                            adenomatous tissue.                           - The findings and recommendations were discussed                            with the patient.                           - The findings and recommendations were discussed                            with the designated responsible adult. Justice Britain, MD 03/08/2020 9:41:23 AM

## 2020-03-10 ENCOUNTER — Telehealth: Payer: Self-pay

## 2020-03-10 NOTE — Telephone Encounter (Signed)
Left message on 2nd follow up call. 

## 2020-03-10 NOTE — Telephone Encounter (Signed)
  Follow up Call-  Call back number 03/08/2020  Post procedure Call Back phone  # [phone]  Permission to leave phone message Yes  Some recent data might be hidden     Left message

## 2020-03-12 ENCOUNTER — Encounter: Payer: Self-pay | Admitting: Gastroenterology

## 2022-03-07 ENCOUNTER — Other Ambulatory Visit: Payer: Self-pay | Admitting: Internal Medicine

## 2022-03-07 DIAGNOSIS — Z1231 Encounter for screening mammogram for malignant neoplasm of breast: Secondary | ICD-10-CM

## 2022-03-29 ENCOUNTER — Ambulatory Visit
Admission: RE | Admit: 2022-03-29 | Discharge: 2022-03-29 | Disposition: A | Discharge status: Home / Self Care | Payer: Managed Care, Other (non HMO) | Source: Ambulatory Visit | Attending: Internal Medicine | Admitting: Internal Medicine

## 2022-03-29 DIAGNOSIS — Z1231 Encounter for screening mammogram for malignant neoplasm of breast: Secondary | ICD-10-CM

## 2022-04-03 ENCOUNTER — Other Ambulatory Visit: Payer: Self-pay | Admitting: *Deleted

## 2022-04-03 ENCOUNTER — Inpatient Hospital Stay
Admission: RE | Admit: 2022-04-03 | Discharge: 2022-04-03 | Disposition: A | Discharge status: Home / Self Care | Payer: Self-pay | Source: Ambulatory Visit | Attending: *Deleted | Admitting: *Deleted

## 2022-04-03 DIAGNOSIS — Z1231 Encounter for screening mammogram for malignant neoplasm of breast: Secondary | ICD-10-CM

## 2022-04-05 ENCOUNTER — Other Ambulatory Visit: Payer: Self-pay | Admitting: Internal Medicine

## 2022-04-05 DIAGNOSIS — N63 Unspecified lump in unspecified breast: Secondary | ICD-10-CM

## 2022-04-05 DIAGNOSIS — R921 Mammographic calcification found on diagnostic imaging of breast: Secondary | ICD-10-CM

## 2022-04-05 DIAGNOSIS — R928 Other abnormal and inconclusive findings on diagnostic imaging of breast: Secondary | ICD-10-CM

## 2022-04-10 ENCOUNTER — Ambulatory Visit
Admission: RE | Admit: 2022-04-10 | Discharge: 2022-04-10 | Disposition: A | Discharge status: Home / Self Care | Payer: Managed Care, Other (non HMO) | Source: Ambulatory Visit | Attending: Internal Medicine | Admitting: Internal Medicine

## 2022-04-10 DIAGNOSIS — R928 Other abnormal and inconclusive findings on diagnostic imaging of breast: Secondary | ICD-10-CM

## 2022-04-10 DIAGNOSIS — R921 Mammographic calcification found on diagnostic imaging of breast: Secondary | ICD-10-CM

## 2022-04-10 DIAGNOSIS — N63 Unspecified lump in unspecified breast: Secondary | ICD-10-CM | POA: Diagnosis present

## 2022-04-12 ENCOUNTER — Encounter: Payer: Self-pay | Admitting: Internal Medicine

## 2022-04-16 ENCOUNTER — Other Ambulatory Visit: Payer: Self-pay | Admitting: Internal Medicine

## 2022-04-16 DIAGNOSIS — R928 Other abnormal and inconclusive findings on diagnostic imaging of breast: Secondary | ICD-10-CM

## 2022-04-16 DIAGNOSIS — N63 Unspecified lump in unspecified breast: Secondary | ICD-10-CM

## 2022-04-16 DIAGNOSIS — R599 Enlarged lymph nodes, unspecified: Secondary | ICD-10-CM

## 2022-04-16 DIAGNOSIS — R921 Mammographic calcification found on diagnostic imaging of breast: Secondary | ICD-10-CM

## 2022-04-24 ENCOUNTER — Ambulatory Visit
Admission: RE | Admit: 2022-04-24 | Discharge: 2022-04-24 | Disposition: A | Discharge status: Home / Self Care | Payer: Managed Care, Other (non HMO) | Source: Ambulatory Visit | Attending: Internal Medicine | Admitting: Internal Medicine

## 2022-04-24 DIAGNOSIS — N63 Unspecified lump in unspecified breast: Secondary | ICD-10-CM | POA: Insufficient documentation

## 2022-04-24 DIAGNOSIS — R928 Other abnormal and inconclusive findings on diagnostic imaging of breast: Secondary | ICD-10-CM

## 2022-04-24 DIAGNOSIS — N6320 Unspecified lump in the left breast, unspecified quadrant: Secondary | ICD-10-CM | POA: Insufficient documentation

## 2022-04-24 DIAGNOSIS — R599 Enlarged lymph nodes, unspecified: Secondary | ICD-10-CM

## 2022-04-24 DIAGNOSIS — R921 Mammographic calcification found on diagnostic imaging of breast: Secondary | ICD-10-CM

## 2022-04-24 HISTORY — PX: BREAST BIOPSY: SHX20

## 2022-04-24 MED ORDER — LIDOCAINE HCL (PF) 1 % IJ SOLN
3.0000 mL | Freq: Once | INTRAMUSCULAR | Status: AC
  Administered 2022-04-24: 3 mL
  Filled 2022-04-24: qty 4

## 2022-04-24 MED ORDER — LIDOCAINE-EPINEPHRINE 1 %-1:100000 IJ SOLN
8.0000 mL | Freq: Once | INTRAMUSCULAR | Status: AC
  Administered 2022-04-24: 8 mL
  Filled 2022-04-24: qty 8

## 2022-04-24 MED ORDER — LIDOCAINE-EPINEPHRINE 1 %-1:100000 IJ SOLN
10.0000 mL | Freq: Once | INTRAMUSCULAR | Status: AC
  Administered 2022-04-24: 10 mL
  Filled 2022-04-24: qty 10

## 2022-04-24 MED ORDER — LIDOCAINE HCL (PF) 1 % IJ SOLN
10.0000 mL | Freq: Once | INTRAMUSCULAR | Status: AC
  Administered 2022-04-24: 10 mL
  Filled 2022-04-24: qty 10

## 2022-04-26 LAB — SURGICAL PATHOLOGY

## 2022-05-25 ENCOUNTER — Other Ambulatory Visit: Payer: Self-pay | Admitting: Internal Medicine

## 2022-05-25 DIAGNOSIS — N63 Unspecified lump in unspecified breast: Secondary | ICD-10-CM

## 2023-03-19 ENCOUNTER — Other Ambulatory Visit: Payer: Self-pay | Admitting: Internal Medicine

## 2023-03-19 DIAGNOSIS — N63 Unspecified lump in unspecified breast: Secondary | ICD-10-CM

## 2023-04-05 ENCOUNTER — Ambulatory Visit
Admission: RE | Admit: 2023-04-05 | Discharge: 2023-04-05 | Disposition: A | Discharge status: Home / Self Care | Payer: BC Managed Care – PPO | Source: Ambulatory Visit | Attending: Internal Medicine | Admitting: Internal Medicine

## 2023-04-05 DIAGNOSIS — N63 Unspecified lump in unspecified breast: Secondary | ICD-10-CM | POA: Diagnosis present

## 2023-09-16 ENCOUNTER — Other Ambulatory Visit: Payer: Self-pay | Admitting: Internal Medicine

## 2023-09-16 DIAGNOSIS — R0602 Shortness of breath: Secondary | ICD-10-CM

## 2023-10-02 ENCOUNTER — Ambulatory Visit
Admission: RE | Admit: 2023-10-02 | Discharge: 2023-10-02 | Disposition: A | Discharge status: Home / Self Care | Source: Ambulatory Visit | Attending: Internal Medicine | Admitting: Internal Medicine

## 2023-10-02 DIAGNOSIS — R0602 Shortness of breath: Secondary | ICD-10-CM | POA: Insufficient documentation

## 2023-10-02 MED ORDER — IOHEXOL 300 MG/ML  SOLN
75.0000 mL | Freq: Once | INTRAMUSCULAR | Status: AC | PRN
  Administered 2023-10-02: 75 mL via INTRAVENOUS

## 2023-10-03 ENCOUNTER — Other Ambulatory Visit
Admission: RE | Admit: 2023-10-03 | Discharge: 2023-10-03 | Disposition: A | Discharge status: Home / Self Care | Source: Ambulatory Visit | Attending: Pulmonary Disease | Admitting: Pulmonary Disease

## 2023-10-03 DIAGNOSIS — R0609 Other forms of dyspnea: Secondary | ICD-10-CM | POA: Diagnosis present

## 2023-10-03 LAB — D-DIMER, QUANTITATIVE: D-Dimer, Quant: 0.39 ug{FEU}/mL (ref 0.00–0.50)

## 2024-03-17 ENCOUNTER — Other Ambulatory Visit: Payer: Self-pay | Admitting: Internal Medicine

## 2024-03-17 DIAGNOSIS — N926 Irregular menstruation, unspecified: Secondary | ICD-10-CM
